# Patient Record
Sex: Male | Born: 1970 | Hispanic: No | Marital: Married | Smoking: Never smoker
Health system: Southern US, Community
[De-identification: ages and names within clinical notes are randomized; demographics above are authoritative.]

## PROBLEM LIST (undated history)

## (undated) DIAGNOSIS — S76399A Other specified injury of muscle, fascia and tendon of the posterior muscle group at thigh level, unspecified thigh, initial encounter: Secondary | ICD-10-CM

## (undated) HISTORY — PX: EYE SURGERY: SHX253

## (undated) HISTORY — DX: Other specified injury of muscle, fascia and tendon of the posterior muscle group at thigh level, unspecified thigh, initial encounter: S76.399A

## (undated) HISTORY — PX: KNEE ARTHROSCOPY W/ ACL RECONSTRUCTION: SHX1858

## (undated) HISTORY — PX: KNEE ARTHROSCOPY: SHX127

## (undated) HISTORY — PX: COLONOSCOPY: SHX174

---

## 2007-02-23 ENCOUNTER — Ambulatory Visit: Payer: Self-pay | Admitting: Internal Medicine

## 2007-02-23 LAB — CONVERTED CEMR LAB
ALT: 27 units/L (ref 0–53)
AST: 20 units/L (ref 0–37)
Albumin: 4.3 g/dL (ref 3.5–5.2)
Basophils Absolute: 0 10*3/uL (ref 0.0–0.1)
Calcium: 10 mg/dL (ref 8.4–10.5)
Chloride: 114 meq/L — ABNORMAL HIGH (ref 96–112)
Creatinine, Ser: 0.9 mg/dL (ref 0.4–1.5)
Eosinophils Absolute: 0 10*3/uL (ref 0.0–0.6)
Eosinophils Relative: 1.1 % (ref 0.0–5.0)
GFR calc non Af Amer: 101 mL/min
Glucose, Bld: 95 mg/dL (ref 70–99)
Ketones, ur: NEGATIVE mg/dL
LDL Cholesterol: 140 mg/dL — ABNORMAL HIGH (ref 0–99)
MCV: 88.5 fL (ref 78.0–100.0)
Nitrite: NEGATIVE
Platelets: 161 10*3/uL (ref 150–400)
RBC: 4.95 M/uL (ref 4.22–5.81)
RDW: 11.9 % (ref 11.5–14.6)
Specific Gravity, Urine: 1.02 (ref 1.000–1.03)
Total Bilirubin: 1.2 mg/dL (ref 0.3–1.2)
Total CHOL/HDL Ratio: 4
Total Protein, Urine: NEGATIVE mg/dL
Urine Glucose: NEGATIVE mg/dL
Urobilinogen, UA: 1 (ref 0.0–1.0)
VLDL: 9 mg/dL (ref 0–40)
WBC: 4 10*3/uL — ABNORMAL LOW (ref 4.5–10.5)
pH: 6.5 (ref 5.0–8.0)

## 2007-02-28 ENCOUNTER — Ambulatory Visit: Payer: Self-pay | Admitting: Internal Medicine

## 2007-02-28 ENCOUNTER — Encounter: Payer: Self-pay | Admitting: Internal Medicine

## 2009-04-29 ENCOUNTER — Ambulatory Visit: Payer: Self-pay | Admitting: Internal Medicine

## 2009-05-03 ENCOUNTER — Ambulatory Visit: Payer: Self-pay | Admitting: Internal Medicine

## 2009-05-03 LAB — CONVERTED CEMR LAB
CO2: 31 meq/L (ref 19–32)
Glucose, Bld: 95 mg/dL (ref 70–99)
HDL: 40.6 mg/dL (ref >39.00)
LDL Cholesterol: 138 mg/dL — ABNORMAL HIGH (ref 0–99)
Potassium: 4.3 meq/L (ref 3.5–5.1)
Sodium: 143 meq/L (ref 135–145)
Total CHOL/HDL Ratio: 5
VLDL: 8.8 mg/dL (ref 0.0–40.0)

## 2009-05-05 ENCOUNTER — Encounter: Payer: Self-pay | Admitting: Internal Medicine

## 2010-06-01 DIAGNOSIS — S76399A Other specified injury of muscle, fascia and tendon of the posterior muscle group at thigh level, unspecified thigh, initial encounter: Secondary | ICD-10-CM

## 2010-06-01 HISTORY — DX: Other specified injury of muscle, fascia and tendon of the posterior muscle group at thigh level, unspecified thigh, initial encounter: S76.399A

## 2010-07-21 ENCOUNTER — Telehealth (INDEPENDENT_AMBULATORY_CARE_PROVIDER_SITE_OTHER): Payer: Self-pay | Admitting: *Deleted

## 2010-07-29 NOTE — Progress Notes (Signed)
  Phone Note Other Incoming   Request: Send information Summary of Call: Request for records received from [facility]. Request forwarded to [facility].  Records for the past 5 yrs.

## 2010-12-07 ENCOUNTER — Other Ambulatory Visit: Payer: Self-pay | Admitting: Orthopedic Surgery

## 2010-12-07 ENCOUNTER — Ambulatory Visit (HOSPITAL_COMMUNITY)
Admission: RE | Admit: 2010-12-07 | Discharge: 2010-12-07 | Disposition: A | Discharge status: Home / Self Care | Payer: 59 | Source: Ambulatory Visit | Attending: Orthopedic Surgery | Admitting: Orthopedic Surgery

## 2010-12-07 DIAGNOSIS — R52 Pain, unspecified: Secondary | ICD-10-CM

## 2010-12-07 DIAGNOSIS — IMO0002 Reserved for concepts with insufficient information to code with codable children: Secondary | ICD-10-CM | POA: Insufficient documentation

## 2010-12-07 DIAGNOSIS — M79609 Pain in unspecified limb: Secondary | ICD-10-CM | POA: Insufficient documentation

## 2012-02-16 ENCOUNTER — Encounter: Payer: Self-pay | Admitting: Internal Medicine

## 2012-02-16 ENCOUNTER — Ambulatory Visit (INDEPENDENT_AMBULATORY_CARE_PROVIDER_SITE_OTHER): Payer: 59 | Admitting: Internal Medicine

## 2012-02-16 VITALS — BP 110/72 | HR 87 | Temp 97.6°F | Resp 16 | Wt 192.0 lb

## 2012-02-16 DIAGNOSIS — Z23 Encounter for immunization: Secondary | ICD-10-CM

## 2012-02-16 DIAGNOSIS — Z0000 Encounter for general adult medical examination without abnormal findings: Secondary | ICD-10-CM

## 2012-02-16 DIAGNOSIS — Z1211 Encounter for screening for malignant neoplasm of colon: Secondary | ICD-10-CM

## 2012-02-16 NOTE — Progress Notes (Signed)
Subjective:    Patient ID: Troy Gentry, male    DOB: 10-Apr-1971, 41 y.o.   MRN: [id number]  HPI Troy Gentry presents for a wellness exam. Interval history since '08: had a torn hamstring. Discussed sexual performance issues. Dentist - current, had crowns. Ophthal - last year -chronic dry eye. Exercise - 30 min on eliptical 5/wk.   Past Medical History  Diagnosis Date  . Avulsion of hamstring muscle 2012   Past Surgical History  Procedure Date  . Knee arthroscopy w/ acl reconstruction     '95, '06 left knee   Family History  Problem Relation Age of Onset  . Arthritis Mother   . Heart disease Father   . Hypertension Father   . Hyperlipidemia Father   . Colitis Sister   . Cancer Paternal Uncle     colon  . Cancer Paternal Grandmother     colon  . Diabetes Paternal Grandmother   . Cancer Paternal Grandfather     colon  . Diabetes Sister   . Heart disease Maternal Grandfather     CAD   History   Social History  . Marital Status: Married    Spouse Name: N/A    Number of Children: 2  . Years of Education: 18   Occupational History  . business Production designer, theatre/television/film    Social History Main Topics  . Smoking status: Never Smoker   . Smokeless tobacco: Never Used  . Alcohol Use: 2.0 - 2.5 oz/week    4-5 drink(s) per week  . Drug Use: No  . Sexually Active: Yes -- Male partner(s)   Other Topics Concern  . Not on file   Social History Narrative   Troy Gentry, Married '98, 1 dtr '01, 1 son '03. 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 Marriage is in good health.     No current outpatient prescriptions on file prior to visit.      Review of Systems Constitutional:  Negative for fever, chills, activity change and unexpected weight change.  HEENT:  Negative for hearing loss, ear pain, congestion, neck stiffness and postnasal drip. Negative for sore throat or swallowing problems. Negative for dental complaints.   Eyes: Negative for vision loss or change in visual acuity.    Respiratory: Negative for chest tightness and wheezing. Negative for DOE.   Cardiovascular: Negative for chest pain or palpitations. No decreased exercise tolerance Gastrointestinal: No change in bowel habit. No bloating or gas. No reflux or indigestion Genitourinary: Negative for urgency, frequency, flank pain and difficulty urinating.  Musculoskeletal: Negative for myalgias, back pain, and gait problem. Right shoulder strain. Neurological: Negative for dizziness, tremors, weakness and headaches.  Hematological: Negative for adenopathy.  Psychiatric/Behavioral: Negative for behavioral problems and dysphoric mood.       Objective:   Physical Exam Filed Vitals:   02/16/12 1459  BP: 110/72  Pulse: 87  Temp: 97.6 F (36.4 C)  Resp: 16   Wt Readings from Last 3 Encounters:  02/16/12 192 lb (87.091 kg)  04/29/09 197 lb (89.359 kg)  02/28/07 188 lb (85.276 kg)   Gen'l: Well nourished well developed white male in no acute distress  HEENT: Head: Normocephalic and atraumatic. Right Ear: External ear normal. EAC/TM nl. Left Ear: External ear normal.  EAC/TM nl. Nose: Nose normal. Mouth/Throat: Oropharynx is clear and moist. Dentition - native, in good repair. No buccal or palatal lesions. Posterior pharynx clear. Eyes: Conjunctivae and sclera clear. EOM intact. Pupils are equal, round, and reactive to light.  Right eye exhibits no discharge. Left eye exhibits no discharge. Neck: Normal range of motion. Neck supple. No JVD present. No tracheal deviation present. No thyromegaly present.  Cardiovascular: Normal rate, regular rhythm, no gallop, no friction rub, no murmur heard.      Quiet precordium. 2+ radial and DP pulses . No carotid bruits Pulmonary/Chest: Effort normal. No respiratory distress or increased WOB, no wheezes, no rales. No chest wall deformity or CVAT. Abdomen: Soft. Bowel sounds are normal in all quadrants. He exhibits no distension, no tenderness, no rebound or guarding, No  heptosplenomegaly  Genitourinary:  deferred Musculoskeletal: Normal range of motion. He exhibits no edema and no tenderness.       Small and large joints without redness, synovial thickening or deformity. Full range of motion preserved about all small, median and large joints.  Lymphadenopathy:    He has no cervical or supraclavicular adenopathy.  Neurological: He is alert and oriented to person, place, and time. CN II-XII intact. DTRs 2+ and symmetrical biceps, radial and patellar tendons. Cerebellar function normal with no tremor, rigidity, normal gait and station.  Skin: Skin is warm and dry. No rash noted. No erythema.  Psychiatric: He has a normal mood and affect. His behavior is normal. Thought content normal.    Lab Results  Component Value Date   WBC 4.0* 02/23/2007   HGB 15.3 02/23/2007   HCT 43.8 02/23/2007   PLT 161 02/23/2007   GLUCOSE 106* 02/19/2012   CHOL 202* 02/19/2012   TRIG 47.0 02/19/2012   HDL 47.30 02/19/2012   LDLDIRECT 137.1 02/19/2012   LDLCALC 138* 05/03/2009        ALT 24 02/19/2012   AST 16 02/19/2012        NA 140 02/19/2012   K 4.5 02/19/2012   CL 106 02/19/2012   CREATININE 1.1 02/19/2012   BUN 20 02/19/2012   CO2 29 02/19/2012   TSH 1.83 02/23/2007   PSA 0.52 02/23/2007          Assessment & Plan:

## 2012-02-19 ENCOUNTER — Other Ambulatory Visit (INDEPENDENT_AMBULATORY_CARE_PROVIDER_SITE_OTHER): Payer: 59

## 2012-02-19 DIAGNOSIS — Z13220 Encounter for screening for lipoid disorders: Secondary | ICD-10-CM

## 2012-02-19 DIAGNOSIS — Z0000 Encounter for general adult medical examination without abnormal findings: Secondary | ICD-10-CM

## 2012-02-19 LAB — COMPREHENSIVE METABOLIC PANEL
AST: 16 U/L (ref 0–37)
Albumin: 4.2 g/dL (ref 3.5–5.2)
Alkaline Phosphatase: 35 U/L — ABNORMAL LOW (ref 39–117)
Glucose, Bld: 106 mg/dL — ABNORMAL HIGH (ref 70–99)
Potassium: 4.5 mEq/L (ref 3.5–5.1)
Sodium: 140 mEq/L (ref 135–145)
Total Bilirubin: 1 mg/dL (ref 0.3–1.2)
Total Protein: 6.7 g/dL (ref 6.0–8.3)

## 2012-02-19 LAB — LIPID PANEL
HDL: 47.3 mg/dL (ref >39.00)
Triglycerides: 47 mg/dL (ref 0.0–149.0)
VLDL: 9.4 mg/dL (ref 0.0–40.0)

## 2012-02-19 LAB — HEPATIC FUNCTION PANEL
ALT: 24 U/L (ref 0–53)
AST: 16 U/L (ref 0–37)
Bilirubin, Direct: 0.1 mg/dL (ref 0.0–0.3)
Total Bilirubin: 1 mg/dL (ref 0.3–1.2)
Total Protein: 6.7 g/dL (ref 6.0–8.3)

## 2012-02-19 LAB — LDL CHOLESTEROL, DIRECT: Direct LDL: 137.1 mg/dL

## 2012-02-20 DIAGNOSIS — Z0000 Encounter for general adult medical examination without abnormal findings: Secondary | ICD-10-CM | POA: Insufficient documentation

## 2012-02-20 NOTE — Assessment & Plan Note (Signed)
Interval medical history negative for any major illness, surgery or injury. Physical exam normal. Immunizations - tetanus brought up to date. Lab - in normal limits except for LDL cholesterol which is just above target level of 130 or less - recommend life-style management with low fat choices and continued regular exercise. 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 Discussed pros and cons of prostate cancer screening (USPHCTF recommendations reviewed and ACU April '13 recommendations) and he defers evaluation at this time.    In summary - a very nice, healthy man who is medically stable. He will continue to exercise and adhere to a low fat diet. He will return in 18-24 months for wellness exam.

## 2012-03-03 ENCOUNTER — Other Ambulatory Visit: Payer: Self-pay | Admitting: Internal Medicine

## 2012-03-03 ENCOUNTER — Telehealth: Payer: Self-pay

## 2012-03-03 DIAGNOSIS — Z1211 Encounter for screening for malignant neoplasm of colon: Secondary | ICD-10-CM

## 2012-03-03 DIAGNOSIS — R739 Hyperglycemia, unspecified: Secondary | ICD-10-CM

## 2012-03-03 NOTE — Telephone Encounter (Signed)
DR.NORINS SPOKE WITH PATIENT REGARDING QUESTIONS FROM PATIENT

## 2012-03-03 NOTE — Telephone Encounter (Signed)
1. ADA normal blood sugar 70-115, he was 95 at previous lab and 106 this lab - both normal. He can certainly have an A1C but I wager it will be normal  2. Last CBC in the office WBC was normal @ 4.0. He was not ill with no evidence of infection so CBC wasn't indicated. He had a very normal history and exam making leukemia or other hematologic disease extremely unlikely.  3. Normal colorectal cancer screening with colonoscopy starts at age 41. Start earlier if 1st degree relative had colon cancer- parent or sibling. Grandparents - 2nd degree kinship - there is not a recommendation for early screening in the absence of symptoms - changed bowel habit etc. Happy to schedule for colonoscopy but do not know if insurance will cover (est cost $2,000 - MD plus endo center)

## 2012-03-03 NOTE — Telephone Encounter (Signed)
Pt called with follow up questions to recent labs results, 1-pt is concerned about glucose elevation although he was fasted, he is requesting and A1C, 2-Pt says his last labs showed an elevated white count and he would like to know why a CBC was not done and 3-pt discussed possible colon screening with MD but has not heard from office on whether this was recommended, please advise.

## 2013-08-03 ENCOUNTER — Other Ambulatory Visit (INDEPENDENT_AMBULATORY_CARE_PROVIDER_SITE_OTHER): Payer: BC Managed Care – PPO

## 2013-08-03 ENCOUNTER — Ambulatory Visit (INDEPENDENT_AMBULATORY_CARE_PROVIDER_SITE_OTHER): Payer: BC Managed Care – PPO | Admitting: Internal Medicine

## 2013-08-03 ENCOUNTER — Encounter: Payer: Self-pay | Admitting: Internal Medicine

## 2013-08-03 VITALS — BP 112/72 | HR 73 | Temp 97.4°F | Ht 71.0 in | Wt 193.0 lb

## 2013-08-03 DIAGNOSIS — Z0000 Encounter for general adult medical examination without abnormal findings: Secondary | ICD-10-CM

## 2013-08-03 DIAGNOSIS — Z800 Family history of malignant neoplasm of digestive organs: Secondary | ICD-10-CM

## 2013-08-03 LAB — LIPID PANEL
CHOL/HDL RATIO: 4
Cholesterol: 217 mg/dL — ABNORMAL HIGH (ref 0–200)
HDL: 56.9 mg/dL (ref >39.00)
LDL Cholesterol: 152 mg/dL — ABNORMAL HIGH (ref 0–99)
TRIGLYCERIDES: 40 mg/dL (ref 0.0–149.0)
VLDL: 8 mg/dL (ref 0.0–40.0)

## 2013-08-03 NOTE — Progress Notes (Signed)
Pre visit review using our clinic review tool, if applicable. No additional management support is needed unless otherwise documented below in the visit note. 

## 2013-08-03 NOTE — Patient Instructions (Signed)
Thanks for coming in to see me today. Moving forward I will pass the baton to Dr. Parks Ranger.  Your exam is normal. You had normal chemistries and do not need a repeat at this time. Will check cholesterol panel and results will be posted to MyChart.   If the viagra or levitra works well MyChart me a message for a prescription.  Will refer for colonoscopy - may get insurance push back.  You should have a routine exam in 2 years or so. If you need lab in less time that should be ok without having to have a full exam, same goes for Rx for Viagra.

## 2013-08-03 NOTE — Progress Notes (Signed)
Subjective:    Patient ID: Troy Gentry, male    DOB: 06-07-1970, 43 y.o.   MRN: [id number]  HPI Troy Gentry presents for an annual wellness exam. Troy Gentry has been doing well with no major medical issues. Troy Gentry is concerned about risk of colon cancer with multiple relatives with colon cancer.   Past Medical History  Diagnosis Date  . Avulsion of hamstring muscle 2012   Past Surgical History  Procedure Laterality Date  . Knee arthroscopy w/ acl reconstruction      '95, '06 left knee   Family History  Problem Relation Age of Onset  . Arthritis Mother   . Heart disease Father   . Hypertension Father   . Hyperlipidemia Father   . Colitis Sister   . Cancer Paternal Uncle     colon  . Cancer Paternal Grandmother     colon  . Diabetes Paternal Grandmother   . Cancer Paternal Grandfather     colon  . Diabetes Sister   . Heart disease Maternal Grandfather     CAD   History   Social History  . Marital Status: Married    Spouse Name: N/A    Number of Children: 2  . Years of Education: 18   Occupational History  . business Freight forwarder    Social History Main Topics  . Smoking status: Never Smoker   . Smokeless tobacco: Never Used  . Alcohol Use: 2 - 2.5 oz/week    4-5 drink(s) per week  . Drug Use: No  . Sexual Activity: Yes    Partners: Female   Other Topics Concern  . Not on file   Social History Narrative   [employer] [facility], Married '98, 1 dtr '01, 1 son '03. Work - Passenger transport manager for [employer]. Marriage is in good health.     Current Outpatient Prescriptions on File Prior to Visit  Medication Sig Dispense Refill  . cycloSPORINE (RESTASIS) 0.05 % ophthalmic emulsion Place 1 drop into both eyes 2 (two) times daily.       No current facility-administered medications on file prior to visit.      Review of Systems Constitutional:  Negative for fever, chills, activity change and unexpected weight change.  HEENT:  Negative for hearing loss, ear pain, congestion, neck  stiffness and postnasal drip. Negative for sore throat or swallowing problems. Negative for dental complaints.   Eyes: Negative for vision loss or change in visual acuity.  Respiratory: Negative for chest tightness and wheezing. Negative for DOE.   Cardiovascular: Negative for chest pain or palpitations. No decreased exercise tolerance Gastrointestinal: No change in bowel habit. No bloating or gas. No reflux or indigestion Genitourinary: Negative for urgency, frequency, flank pain and difficulty urinating.  Musculoskeletal: Negative for myalgias, back pain, arthralgias and gait problem.  Neurological: Negative for dizziness, tremors, weakness and headaches.  Hematological: Negative for adenopathy.  Psychiatric/Behavioral: Negative for behavioral problems and dysphoric mood.       Objective:   Physical Exam Filed Vitals:   08/03/13 1008  BP: 112/72  Pulse: 73  Temp: 97.4 F (36.3 C)   Wt Readings from Last 3 Encounters:  08/03/13 193 lb (87.544 kg)  02/16/12 192 lb (87.091 kg)  04/29/09 197 lb (89.359 kg)   Gen'l: Well nourished well developed male in no acute distress  HEENT: Head: Normocephalic and atraumatic. Right Ear: External ear normal. EAC/TM nl. Left Ear: External ear normal.  EAC/TM nl. Nose: Nose normal. Mouth/Throat: Oropharynx is clear and  moist. Dentition - native, in good repair. No buccal or palatal lesions. Posterior pharynx clear. Eyes: Conjunctivae and sclera clear. EOM intact. Pupils are equal, round, and reactive to light. Right eye exhibits no discharge. Left eye exhibits no discharge. Neck: Normal range of motion. Neck supple. No JVD present. No tracheal deviation present. No thyromegaly present.  Cardiovascular: Normal rate, regular rhythm, no gallop, no friction rub, no murmur heard.      Quiet precordium. 2+ radial and DP pulses . No carotid bruits Pulmonary/Chest: Effort normal. No respiratory distress or increased WOB, no wheezes, no rales. No chest wall  deformity or CVAT. Abdomen: Soft. Bowel sounds are normal in all quadrants. Troy Gentry exhibits no distension, no tenderness, no rebound or guarding, No heptosplenomegaly  Genitourinary: deferred  Musculoskeletal: Normal range of motion. Troy Gentry exhibits no edema and no tenderness.       Small and large joints without redness, synovial thickening or deformity. Full range of motion preserved about all small, median and large joints.  Lymphadenopathy:    Troy Gentry has no cervical or supraclavicular adenopathy.  Neurological: Troy Gentry is alert and oriented to person, place, and time. CN II-XII intact. DTRs 2+ and symmetrical biceps, radial and patellar tendons. Cerebellar function normal with no tremor, rigidity, normal gait and station.  Skin: Skin is warm and dry. No rash noted. No erythema.  Psychiatric: Troy Gentry has a normal mood and affect. His behavior is normal. Thought content normal.   Recent Results (from the past 2160 hour(s))  LIPID PANEL     Status: Abnormal   Collection Time    08/03/13 11:16 AM      Result Value Ref Range   Cholesterol 217 (*) 0 - 200 mg/dL   Comment: ATP III Classification       Desirable:  < 200 mg/dL               Borderline High:  200 - 239 mg/dL          High:  > = 240 mg/dL   Triglycerides 40.0  0.0 - 149.0 mg/dL   Comment: Normal:  <150 mg/dLBorderline High:  150 - 199 mg/dL   HDL 56.90  >39.00 mg/dL   VLDL 8.0  0.0 - 40.0 mg/dL   LDL Cholesterol 152 (*) 0 - 99 mg/dL   Total CHOL/HDL Ratio 4     Comment:                Men          Women1/2 Average Risk     3.4          3.3Average Risk          5.0          4.42X Average Risk          9.6          7.13X Average Risk          15.0          11.0                               Assessment & Plan:

## 2013-08-04 ENCOUNTER — Encounter: Payer: Self-pay | Admitting: Internal Medicine

## 2013-08-04 DIAGNOSIS — R899 Unspecified abnormal finding in specimens from other organs, systems and tissues: Secondary | ICD-10-CM

## 2013-08-06 NOTE — Assessment & Plan Note (Signed)
Interval history is negative for major illness, injury or surgery. Physical exam is normal. Lab results - moderate elevation in LDL at 152. Patient informed by Mychart - over goal LDL below treatment threshold (NCEP ATP III)  - he will decide if he can improve life-style or if he wants to take medication. Immunization - current tetanus.  In summary A nice man, medically stable who should approach cholesterol elevation with diet and exercise as first choice treatment.

## 2013-09-11 ENCOUNTER — Encounter: Payer: Self-pay | Admitting: Internal Medicine

## 2013-10-17 ENCOUNTER — Other Ambulatory Visit: Payer: Self-pay | Admitting: Orthopedic Surgery

## 2013-10-17 DIAGNOSIS — M79642 Pain in left hand: Secondary | ICD-10-CM

## 2013-10-27 ENCOUNTER — Ambulatory Visit
Admission: RE | Admit: 2013-10-27 | Discharge: 2013-10-27 | Disposition: A | Discharge status: Home / Self Care | Payer: BC Managed Care – PPO | Source: Ambulatory Visit | Attending: Orthopedic Surgery | Admitting: Orthopedic Surgery

## 2013-10-27 ENCOUNTER — Other Ambulatory Visit: Payer: BC Managed Care – PPO

## 2013-10-27 DIAGNOSIS — M79642 Pain in left hand: Secondary | ICD-10-CM

## 2014-10-08 ENCOUNTER — Other Ambulatory Visit: Payer: Self-pay

## 2014-10-08 NOTE — Telephone Encounter (Signed)
Received this script from pharmacy for Viagra. Do not see on current med list

## 2014-10-08 NOTE — Telephone Encounter (Signed)
Dr Linda Hedges retired ; it will be necessary for him to establish with a primary care physician for additional refills.

## 2014-10-17 ENCOUNTER — Other Ambulatory Visit: Payer: Self-pay | Admitting: Geriatric Medicine

## 2014-11-08 ENCOUNTER — Telehealth: Payer: Self-pay | Admitting: Internal Medicine

## 2014-11-08 NOTE — Telephone Encounter (Signed)
Spoke with patient about transferring from [facility] to another provider. Patient stated he would have to call back because he was not sure what he wanted to do right now.

## 2015-01-25 ENCOUNTER — Other Ambulatory Visit: Payer: Self-pay | Admitting: Otolaryngology

## 2015-01-25 ENCOUNTER — Other Ambulatory Visit (HOSPITAL_COMMUNITY)
Admission: RE | Admit: 2015-01-25 | Discharge: 2015-01-25 | Disposition: A | Discharge status: Home / Self Care | Payer: BLUE CROSS/BLUE SHIELD | Source: Ambulatory Visit | Attending: Otolaryngology | Admitting: Otolaryngology

## 2015-01-25 DIAGNOSIS — R221 Localized swelling, mass and lump, neck: Secondary | ICD-10-CM | POA: Diagnosis present

## 2015-01-30 ENCOUNTER — Other Ambulatory Visit: Payer: Self-pay | Admitting: Otolaryngology

## 2015-01-30 DIAGNOSIS — R221 Localized swelling, mass and lump, neck: Secondary | ICD-10-CM

## 2015-02-22 ENCOUNTER — Ambulatory Visit
Admission: RE | Admit: 2015-02-22 | Discharge: 2015-02-22 | Disposition: A | Discharge status: Home / Self Care | Payer: BLUE CROSS/BLUE SHIELD | Source: Ambulatory Visit | Attending: Otolaryngology | Admitting: Otolaryngology

## 2015-02-22 DIAGNOSIS — R221 Localized swelling, mass and lump, neck: Secondary | ICD-10-CM

## 2015-05-08 ENCOUNTER — Encounter: Payer: Self-pay | Admitting: Internal Medicine

## 2015-06-07 ENCOUNTER — Other Ambulatory Visit (HOSPITAL_COMMUNITY): Payer: Self-pay | Admitting: Orthopedic Surgery

## 2015-06-13 ENCOUNTER — Encounter (HOSPITAL_COMMUNITY)
Admission: RE | Admit: 2015-06-13 | Discharge: 2015-06-13 | Disposition: A | Discharge status: Home / Self Care | Payer: Managed Care, Other (non HMO) | Source: Ambulatory Visit | Attending: Orthopedic Surgery | Admitting: Orthopedic Surgery

## 2015-06-13 ENCOUNTER — Encounter (HOSPITAL_COMMUNITY): Payer: Self-pay

## 2015-06-13 DIAGNOSIS — Z01812 Encounter for preprocedural laboratory examination: Secondary | ICD-10-CM | POA: Insufficient documentation

## 2015-06-13 LAB — CBC
HEMATOCRIT: 43 % (ref 39.0–52.0)
Hemoglobin: 14.6 g/dL (ref 13.0–17.0)
MCH: 30 pg (ref 26.0–34.0)
MCHC: 34 g/dL (ref 30.0–36.0)
MCV: 88.5 fL (ref 78.0–100.0)
Platelets: 134 10*3/uL — ABNORMAL LOW (ref 150–400)
RBC: 4.86 MIL/uL (ref 4.22–5.81)
RDW: 13.2 % (ref 11.5–15.5)
WBC: 5.1 10*3/uL (ref 4.0–10.5)

## 2015-06-13 NOTE — Pre-Procedure Instructions (Addendum)
    JAADEN CLOTFELTER  06/13/2015    Your procedure is scheduled on Monday, January 16.  Report to [facility] Admitting at 9:00 A.M.  Call this number if you have problems the morning of surgery:[phone]   Remember:  Do not eat food or drink liquids after midnight Sunday, January 15.  Take these medicines the morning of surgery with A SIP OF WATER : None             STOP taking Voltaren .  Do Nott take Aspirin, Aspirin Products, Advil ( Ibuprofen), Aleve (Naproxen).   Do not wear jewelry, make-up or nail polish.  Do not wear lotions, powders, or perfumes             Men may shave face and neck.  Do not bring valuables to the hospital.  [facility] is not responsible for any belongings or valuables.  Contacts, dentures or bridgework may not be worn into surgery.  Leave your suitcase in the car.  After surgery it may be brought to your room.  For patients admitted to the hospital, discharge time will be determined by your treatment team.  Patients discharged the day of surgery will not be allowed to drive home.   Name and phone number of your driver:  -  Special instructions:  Review  [place] - Preparing For Surgery.  Please read over the following fact sheets that you were given. Pain Booklet, Coughing and Deep Breathing and Surgical Site Infection Prevention

## 2015-06-14 MED ORDER — CEFAZOLIN SODIUM-DEXTROSE 2-3 GM-% IV SOLR
2.0000 g | INTRAVENOUS | Status: AC
  Administered 2015-06-17: 2 g via INTRAVENOUS
  Filled 2015-06-14: qty 50

## 2015-06-14 MED ORDER — CHLORHEXIDINE GLUCONATE 4 % EX LIQD: 60.0000 mL | Freq: Once | CUTANEOUS | Status: DC

## 2015-06-14 NOTE — H&P (Signed)
Troy Gentry is an 45 y.o. male.   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 He's had shoulder pain for many months but has become worse as he is tried to throw with his son. Reports pain with extension and abduction. Localizes the pain to the front of the shoulder. Does have some mechanical symptoms at times but really has a lot of pain whenever he tries to throw the ball. He is fairly asymptomatic when he plays golf. He denies any neck pain or radicular symptoms of numbness and tingling in the right arm  Past Medical History  Diagnosis Date  . Avulsion of hamstring muscle 2012    Past Surgical History  Procedure Laterality Date  . Knee arthroscopy w/ acl reconstruction      '95, '06 left knee  . Eye surgery      Lasik    Family History  Problem Relation Age of Onset  . Arthritis Mother   . Heart disease Father   . Hypertension Father   . Hyperlipidemia Father   . Colitis Sister   . Cancer Paternal Uncle     colon  . Cancer Paternal Grandmother     colon  . Diabetes Paternal Grandmother   . Cancer Paternal Grandfather     colon  . Diabetes Sister   . Heart disease Maternal Grandfather     CAD   Social History:  reports that he has never smoked. He has never used smokeless tobacco. He reports that he drinks about 4.2 - 4.8 oz of alcohol per week. He reports that he does not use illicit drugs.  Allergies: No Known Allergies  No prescriptions prior to admission    Results for orders placed or performed during the hospital encounter of 06/13/15 (from the past 48 hour(s))  CBC     Status: Abnormal   Collection Time: 06/13/15 11:24 AM  Result Value Ref Range   WBC 5.1 4.0 - 10.5 K/uL   RBC 4.86 4.22 - 5.81 MIL/uL   Hemoglobin 14.6 13.0 - 17.0 g/dL   HCT 43.0 39.0 - 52.0 %   MCV 88.5 78.0 - 100.0 fL   MCH 30.0 26.0 - 34.0 pg   MCHC 34.0 30.0 - 36.0 g/dL   RDW 13.2 11.5 - 15.5 %   Platelets 134 (L)  150 - 400 K/uL   No results found.  Review of Systems  Constitutional: Negative.   HENT: Negative.   Eyes: Negative.   Respiratory: Negative.   Cardiovascular: Negative.   Gastrointestinal: Negative.   Genitourinary: Negative.   Musculoskeletal: Positive for joint pain.  Skin: Negative.   Neurological: Negative.   Endo/Heme/Allergies: Negative.   Psychiatric/Behavioral: Negative.     There were no vitals taken for this visit. Physical Exam  Constitutional: He appears well-developed.  HENT:  Head: Normocephalic.  Eyes: Pupils are equal, round, and reactive to light.  Neck: Normal range of motion.  Cardiovascular: Normal rate.   Respiratory: Effort normal.  GI: Soft.  Neurological: He is alert.  Skin: Skin is warm.   examination of the right arm demonstrates good range of motion positive O'Brien's testing no restriction extra rotation 15 abduction no before meals joint tenderness direct palpation or with cross arm adduction no other masses lymphadenopathy or skin changes noted in the shoulder girdle region has good strength interspace of space upset muscle testing motor sensory function to the arm is intact  Assessment/Plan Impression is right shoulder biceps  tendon subluxation medially with tearing of the upper fibers of the subscap plan arthroscopy with biceps tenodesis bursectomy any open rotator cuff repair of the upper portion of the subscap. Risk and benefits discussed with the patient including but limited to infection or vessel damage shoulder stiffness potential for rerupture the biceps tendon. Anticipated recovery course discussed will plan to use CPM machine postop for this patient all questions answered  Hinton Troy Gentry 06/14/2015, 9:56 PM

## 2015-06-17 ENCOUNTER — Ambulatory Visit (HOSPITAL_COMMUNITY): Payer: Managed Care, Other (non HMO) | Admitting: Certified Registered Nurse Anesthetist

## 2015-06-17 ENCOUNTER — Ambulatory Visit (HOSPITAL_COMMUNITY)
Admission: RE | Admit: 2015-06-17 | Discharge: 2015-06-17 | Disposition: A | Discharge status: Home / Self Care | Payer: Managed Care, Other (non HMO) | Source: Ambulatory Visit | Attending: Orthopedic Surgery | Admitting: Orthopedic Surgery

## 2015-06-17 ENCOUNTER — Encounter (HOSPITAL_COMMUNITY)
Admission: RE | Disposition: A | Discharge status: Home / Self Care | Payer: Self-pay | Source: Ambulatory Visit | Attending: Orthopedic Surgery

## 2015-06-17 ENCOUNTER — Encounter (HOSPITAL_COMMUNITY): Payer: Self-pay | Admitting: Certified Registered Nurse Anesthetist

## 2015-06-17 DIAGNOSIS — S43491A Other sprain of right shoulder joint, initial encounter: Secondary | ICD-10-CM | POA: Diagnosis not present

## 2015-06-17 DIAGNOSIS — S43081A Other subluxation of right shoulder joint, initial encounter: Secondary | ICD-10-CM | POA: Insufficient documentation

## 2015-06-17 DIAGNOSIS — X58XXXA Exposure to other specified factors, initial encounter: Secondary | ICD-10-CM | POA: Insufficient documentation

## 2015-06-17 HISTORY — PX: SHOULDER ARTHROSCOPY: SHX128

## 2015-06-17 SURGERY — ARTHROSCOPY, SHOULDER
Anesthesia: General | Site: Shoulder | Laterality: Right

## 2015-06-17 MED ORDER — SODIUM CHLORIDE 0.9 % IJ SOLN
INTRAMUSCULAR | Status: DC | PRN
  Administered 2015-06-17: 30 mL via INTRAVENOUS

## 2015-06-17 MED ORDER — METOPROLOL TARTRATE 1 MG/ML IV SOLN
INTRAVENOUS | Status: AC
  Filled 2015-06-17: qty 5

## 2015-06-17 MED ORDER — OXYCODONE-ACETAMINOPHEN 5-325 MG PO TABS: 1.0000 | ORAL_TABLET | Freq: Four times a day (QID) | ORAL | Status: DC | PRN

## 2015-06-17 MED ORDER — HYDROMORPHONE HCL 1 MG/ML IJ SOLN: 0.2500 mg | INTRAMUSCULAR | Status: DC | PRN

## 2015-06-17 MED ORDER — LACTATED RINGERS IV SOLN
INTRAVENOUS | Status: DC | PRN
  Administered 2015-06-17 (×4): via INTRAVENOUS

## 2015-06-17 MED ORDER — ONDANSETRON HCL 4 MG/2ML IJ SOLN
INTRAMUSCULAR | Status: AC
  Filled 2015-06-17: qty 2

## 2015-06-17 MED ORDER — EPHEDRINE SULFATE 50 MG/ML IJ SOLN
INTRAMUSCULAR | Status: DC | PRN
  Administered 2015-06-17: 10 mg via INTRAVENOUS
  Administered 2015-06-17: 15 mg via INTRAVENOUS
  Administered 2015-06-17 (×2): 5 mg via INTRAVENOUS
  Administered 2015-06-17: 10 mg via INTRAVENOUS
  Administered 2015-06-17: 5 mg via INTRAVENOUS

## 2015-06-17 MED ORDER — SUGAMMADEX SODIUM 200 MG/2ML IV SOLN
INTRAVENOUS | Status: AC
  Filled 2015-06-17: qty 2

## 2015-06-17 MED ORDER — BUPIVACAINE HCL (PF) 0.25 % IJ SOLN
INTRAMUSCULAR | Status: DC | PRN
Start: 2015-06-17 — End: 2015-06-17
  Administered 2015-06-17: 1 mL

## 2015-06-17 MED ORDER — EPINEPHRINE HCL 1 MG/ML IJ SOLN
INTRAMUSCULAR | Status: AC
  Filled 2015-06-17: qty 1

## 2015-06-17 MED ORDER — PROMETHAZINE HCL 25 MG/ML IJ SOLN: 6.2500 mg | INTRAMUSCULAR | Status: DC | PRN

## 2015-06-17 MED ORDER — FENTANYL CITRATE (PF) 250 MCG/5ML IJ SOLN
INTRAMUSCULAR | Status: DC | PRN
  Administered 2015-06-17 (×2): 50 ug via INTRAVENOUS

## 2015-06-17 MED ORDER — MIDAZOLAM HCL 2 MG/2ML IJ SOLN
INTRAMUSCULAR | Status: AC
  Administered 2015-06-17: 2 mg
  Filled 2015-06-17: qty 2

## 2015-06-17 MED ORDER — SUGAMMADEX SODIUM 200 MG/2ML IV SOLN
INTRAVENOUS | Status: DC | PRN
  Administered 2015-06-17: 178.8 mg via INTRAVENOUS

## 2015-06-17 MED ORDER — NEOSTIGMINE METHYLSULFATE 10 MG/10ML IV SOLN
INTRAVENOUS | Status: AC
  Filled 2015-06-17: qty 1

## 2015-06-17 MED ORDER — GLYCOPYRROLATE 0.2 MG/ML IJ SOLN
INTRAMUSCULAR | Status: AC
  Filled 2015-06-17: qty 4

## 2015-06-17 MED ORDER — FENTANYL CITRATE (PF) 100 MCG/2ML IJ SOLN
INTRAMUSCULAR | Status: AC
  Filled 2015-06-17: qty 2

## 2015-06-17 MED ORDER — METHOCARBAMOL 500 MG PO TABS: 500.0000 mg | ORAL_TABLET | Freq: Four times a day (QID) | ORAL | Status: DC

## 2015-06-17 MED ORDER — PROPOFOL 10 MG/ML IV BOLUS
INTRAVENOUS | Status: DC | PRN
  Administered 2015-06-17: 200 mg via INTRAVENOUS

## 2015-06-17 MED ORDER — SODIUM CHLORIDE 0.9 % IR SOLN
Status: DC | PRN
  Administered 2015-06-17 (×2): 3000 mL

## 2015-06-17 MED ORDER — FENTANYL CITRATE (PF) 100 MCG/2ML IJ SOLN
INTRAMUSCULAR | Status: AC
  Administered 2015-06-17: 75 ug
  Filled 2015-06-17: qty 2

## 2015-06-17 MED ORDER — PHENYLEPHRINE 40 MCG/ML (10ML) SYRINGE FOR IV PUSH (FOR BLOOD PRESSURE SUPPORT)
PREFILLED_SYRINGE | INTRAVENOUS | Status: AC
  Filled 2015-06-17: qty 10

## 2015-06-17 MED ORDER — EPHEDRINE SULFATE 50 MG/ML IJ SOLN
INTRAMUSCULAR | Status: AC
  Filled 2015-06-17: qty 1

## 2015-06-17 MED ORDER — GLYCOPYRROLATE 0.2 MG/ML IJ SOLN
INTRAMUSCULAR | Status: DC | PRN
  Administered 2015-06-17: 0.2 mg via INTRAVENOUS

## 2015-06-17 MED ORDER — BUPIVACAINE HCL (PF) 0.25 % IJ SOLN
INTRAMUSCULAR | Status: AC
  Filled 2015-06-17: qty 30

## 2015-06-17 MED ORDER — GLYCOPYRROLATE 0.2 MG/ML IJ SOLN
INTRAMUSCULAR | Status: AC
  Filled 2015-06-17: qty 1

## 2015-06-17 MED ORDER — ONDANSETRON HCL 4 MG/2ML IJ SOLN
INTRAMUSCULAR | Status: DC | PRN
  Administered 2015-06-17: 4 mg via INTRAVENOUS

## 2015-06-17 MED ORDER — PHENYLEPHRINE HCL 10 MG/ML IJ SOLN
INTRAMUSCULAR | Status: DC | PRN
  Administered 2015-06-17: 80 ug via INTRAVENOUS

## 2015-06-17 MED ORDER — ARTIFICIAL TEARS OP OINT
TOPICAL_OINTMENT | OPHTHALMIC | Status: DC | PRN
  Administered 2015-06-17: 1 via OPHTHALMIC

## 2015-06-17 MED ORDER — BUPIVACAINE-EPINEPHRINE (PF) 0.5% -1:200000 IJ SOLN
INTRAMUSCULAR | Status: DC | PRN
  Administered 2015-06-17: 25 mL via PERINEURAL

## 2015-06-17 MED ORDER — PROPOFOL 10 MG/ML IV BOLUS
INTRAVENOUS | Status: AC
  Filled 2015-06-17: qty 20

## 2015-06-17 MED ORDER — ROCURONIUM BROMIDE 50 MG/5ML IV SOLN
INTRAVENOUS | Status: AC
  Filled 2015-06-17: qty 1

## 2015-06-17 MED ORDER — EPINEPHRINE HCL 1 MG/ML IJ SOLN
INTRAMUSCULAR | Status: DC | PRN
  Administered 2015-06-17: 1 mg

## 2015-06-17 MED ORDER — FENTANYL CITRATE (PF) 250 MCG/5ML IJ SOLN
INTRAMUSCULAR | Status: AC
  Filled 2015-06-17: qty 5

## 2015-06-17 MED ORDER — LIDOCAINE HCL (CARDIAC) 20 MG/ML IV SOLN
INTRAVENOUS | Status: AC
  Filled 2015-06-17: qty 5

## 2015-06-17 MED ORDER — ROCURONIUM BROMIDE 100 MG/10ML IV SOLN
INTRAVENOUS | Status: DC | PRN
  Administered 2015-06-17: 50 mg via INTRAVENOUS

## 2015-06-17 MED ORDER — ARTIFICIAL TEARS OP OINT
TOPICAL_OINTMENT | OPHTHALMIC | Status: AC
  Filled 2015-06-17: qty 3.5

## 2015-06-17 MED ORDER — LIDOCAINE HCL (CARDIAC) 20 MG/ML IV SOLN
INTRAVENOUS | Status: DC | PRN
  Administered 2015-06-17: 80 mg via INTRATRACHEAL

## 2015-06-17 SURGICAL SUPPLY — 84 items
2.9MM JUGGERKNOT ×1 IMPLANT
ANCH SUT 2 2.9 2 LD BLU (Anchor) ×1 IMPLANT
ANCHOR JUGGERKNOT 2.9 (Anchor) ×1 IMPLANT
APL SKNCLS STERI-STRIP NONHPOA (GAUZE/BANDAGES/DRESSINGS) ×1
BENZOIN TINCTURE PRP APPL 2/3 (GAUZE/BANDAGES/DRESSINGS) ×2 IMPLANT
BIT DRILL TAK (DRILL) IMPLANT
BLADE CUDA 5.5 (BLADE) IMPLANT
BLADE CUTTER GATOR 3.5 (BLADE) ×2 IMPLANT
BLADE GREAT WHITE 4.2 (BLADE) ×2 IMPLANT
BLADE SURG 11 STRL SS (BLADE) ×2 IMPLANT
BUR GATOR 2.9 (BURR) IMPLANT
BUR OVAL 6.0 (BURR) ×2 IMPLANT
CANNULA SHOULDER 7CM (CANNULA) IMPLANT
CARTRIDGE CURVETEK MED (MISCELLANEOUS) IMPLANT
CARTRIDGE CURVETEK XLRG (MISCELLANEOUS) IMPLANT
CLSR STERI-STRIP ANTIMIC 1/2X4 (GAUZE/BANDAGES/DRESSINGS) ×1 IMPLANT
COVER SURGICAL LIGHT HANDLE (MISCELLANEOUS) ×2 IMPLANT
DRAPE IMP U-DRAPE 54X76 (DRAPES) ×2 IMPLANT
DRAPE INCISE IOBAN 66X45 STRL (DRAPES) ×4 IMPLANT
DRAPE STERI 35X30 U-POUCH (DRAPES) ×2 IMPLANT
DRAPE U-SHAPE 47X51 STRL (DRAPES) ×4 IMPLANT
DRILL TAK (DRILL)
DRSG PAD ABDOMINAL 8X10 ST (GAUZE/BANDAGES/DRESSINGS) ×6 IMPLANT
DRSG TEGADERM 4X4.75 (GAUZE/BANDAGES/DRESSINGS) ×1 IMPLANT
DURAPREP 26ML APPLICATOR (WOUND CARE) ×2 IMPLANT
ELECT MENISCUS 165MM 90D (ELECTRODE) IMPLANT
ELECT REM PT RETURN 9FT ADLT (ELECTROSURGICAL) ×2
ELECTRODE REM PT RTRN 9FT ADLT (ELECTROSURGICAL) ×1 IMPLANT
FILTER STRAW FLUID ASPIR (MISCELLANEOUS) ×2 IMPLANT
GAUZE SPONGE 4X4 12PLY STRL (GAUZE/BANDAGES/DRESSINGS) ×2 IMPLANT
GAUZE XEROFORM 1X8 LF (GAUZE/BANDAGES/DRESSINGS) ×2 IMPLANT
GLOVE BIO SURGEON ST LM GN SZ9 (GLOVE) ×2 IMPLANT
GLOVE BIOGEL PI IND STRL 8 (GLOVE) ×1 IMPLANT
GLOVE BIOGEL PI INDICATOR 8 (GLOVE) ×1
GLOVE BIOGEL PI ORTHO PRO SZ7 (GLOVE) ×1
GLOVE PI ORTHO PRO STRL SZ7 (GLOVE) IMPLANT
GLOVE SURG ORTHO 8.0 STRL STRW (GLOVE) ×2 IMPLANT
GLOVE SURG SS PI 8.0 STRL IVOR (GLOVE) ×1 IMPLANT
GOWN STRL REUS W/ TWL LRG LVL3 (GOWN DISPOSABLE) ×3 IMPLANT
GOWN STRL REUS W/TWL LRG LVL3 (GOWN DISPOSABLE) ×6
KIT BASIN OR (CUSTOM PROCEDURE TRAY) ×2 IMPLANT
KIT BIO-TENODESIS 3X8 DISP (MISCELLANEOUS) ×2
KIT INSRT BABSR STRL DISP BTN (MISCELLANEOUS) IMPLANT
KIT JUGGERKNOT DISP 2.9MM (KITS) ×1 IMPLANT
KIT ROOM TURNOVER OR (KITS) ×2 IMPLANT
MANIFOLD NEPTUNE II (INSTRUMENTS) ×2 IMPLANT
NDL HYPO 25X1 1.5 SAFETY (NEEDLE) ×1 IMPLANT
NDL SPNL 18GX3.5 QUINCKE PK (NEEDLE) ×1 IMPLANT
NDL SUT 6 .5 CRC .975X.05 MAYO (NEEDLE) ×1 IMPLANT
NEEDLE HYPO 25X1 1.5 SAFETY (NEEDLE) ×2 IMPLANT
NEEDLE MAYO TAPER (NEEDLE) ×4
NEEDLE SPNL 18GX3.5 QUINCKE PK (NEEDLE) ×2 IMPLANT
NS IRRIG 1000ML POUR BTL (IV SOLUTION) ×2 IMPLANT
PACK SHOULDER (CUSTOM PROCEDURE TRAY) ×2 IMPLANT
PACK UNIVERSAL I (CUSTOM PROCEDURE TRAY) ×2 IMPLANT
PAD ARMBOARD 7.5X6 YLW CONV (MISCELLANEOUS) ×4 IMPLANT
SCREW TENODESIS BIOCOMP 7MM ×1 IMPLANT
SCREW TENODESIS BIOCOMP 7MM (Screw) ×1 IMPLANT
SET ARTHROSCOPY TUBING (MISCELLANEOUS) ×2
SET ARTHROSCOPY TUBING LN (MISCELLANEOUS) ×1 IMPLANT
SLING ARM IMMOBILIZER MED (SOFTGOODS) IMPLANT
SPEAR FASTAKII (SLEEVE) IMPLANT
SPONGE LAP 4X18 X RAY DECT (DISPOSABLE) ×4 IMPLANT
STRIP CLOSURE SKIN 1/2X4 (GAUZE/BANDAGES/DRESSINGS) ×2 IMPLANT
SUCTION FRAZIER TIP 10 FR DISP (SUCTIONS) ×2 IMPLANT
SUT ETHILON 3 0 PS 1 (SUTURE) ×2 IMPLANT
SUT FIBERWIRE 2-0 18 17.9 3/8 (SUTURE)
SUT MNCRL AB 3-0 PS2 18 (SUTURE) ×1 IMPLANT
SUT PROLENE 3 0 PS 2 (SUTURE) ×2 IMPLANT
SUT VIC AB 0 CT1 27 (SUTURE) ×6
SUT VIC AB 0 CT1 27XBRD ANBCTR (SUTURE) ×2 IMPLANT
SUT VIC AB 1 CT1 27 (SUTURE) ×2
SUT VIC AB 1 CT1 27XBRD ANBCTR (SUTURE) ×1 IMPLANT
SUT VIC AB 2-0 CT1 27 (SUTURE) ×2
SUT VIC AB 2-0 CT1 TAPERPNT 27 (SUTURE) ×1 IMPLANT
SUT VICRYL 0 UR6 27IN ABS (SUTURE) IMPLANT
SUTURE FIBERWR 2-0 18 17.9 3/8 (SUTURE) IMPLANT
SYR 20CC LL (SYRINGE) ×4 IMPLANT
SYR 3ML LL SCALE MARK (SYRINGE) ×2 IMPLANT
SYR TB 1ML LUER SLIP (SYRINGE) ×2 IMPLANT
TOWEL OR 17X24 6PK STRL BLUE (TOWEL DISPOSABLE) ×2 IMPLANT
TOWEL OR 17X26 10 PK STRL BLUE (TOWEL DISPOSABLE) ×2 IMPLANT
WAND HAND CNTRL MULTIVAC 90 (MISCELLANEOUS) IMPLANT
WATER STERILE IRR 1000ML POUR (IV SOLUTION) ×2 IMPLANT

## 2015-06-17 NOTE — Addendum Note (Signed)
Addendum  created 06/17/15 2022 by Fidela Juneau, CRNA   Modules edited: Anesthesia Events, Narrator   Narrator:  Narrator: Event Log Edited

## 2015-06-17 NOTE — Op Note (Signed)
Troy Gentry, Troy Gentry NO.:  [id number]  MEDICAL RECORD NO.:  [id number]  LOCATION:  [facility]                         FACILITY:  [facility]  PHYSICIAN:  Anderson Malta, M.D.    DATE OF BIRTH:  1971/01/13  DATE OF PROCEDURE: DATE OF DISCHARGE:  06/17/2015                              OPERATIVE REPORT   PREOPERATIVE DIAGNOSIS:  Right shoulder partial subscapularis tear with medial subluxation of the biceps tendon.  POSTOPERATIVE DIAGNOSIS:  Right shoulder partial subscapularis tear with medial subluxation of the biceps tendon.  PROCEDURES:  Right shoulder diagnostic arthroscopy, biceps tendon release, open biceps tenodesis with repair of the upper subscapularis.  SURGEON:  Anderson Malta, M.D.  ASSISTANT:  Johnell Comings.  ANESTHESIA:  General.  INDICATIONS:  Troy Gentry is a 45 year old patient with right shoulder pain refractory to nonoperative management.  MRI scan shows subluxation of the biceps tendon medially, presents for operative management after explanation of the risks and benefits.  PROCEDURE IN DETAIL:  The patient was brought to the operating room where general endotracheal anesthesia was introduced.  Preoperative antibiotics were administered.  Time-out was called.  Right shoulder exam under anesthesia, found to have excellent stability anterior and posterior with full range of motion.  External rotation was 15 degrees with abduction was about 60 degrees, full forward flexion and abduction present.  After examination under anesthesia, the patient was placed in beach-chair position, head in neutral position.  Right shoulder was prescrubbed with alcohol and Betadine, allowed to air dry, prepped with DuraPrep solution and draped in a sterile manner.  Charlie Pitter was used to cover under the axilla.  Solution of saline and epinephrine injected into the subacromial space.  Posterior portal created 2 cm medial and inferior to the posterolateral margin of the  acromion.  Diagnostic arthroscopy was performed.  Anterior portal created under direct visualization.  Rotator cuff was intact.  The biceps anchor was actually stable, but the biceps tendon itself was subluxated.  Partial tearing of the upper subscap was present.  There was split longitudinal tearing in accordance with the MRI scan of the biceps tendon.  Biceps tendon was released.  Anterior-inferior, posterior-inferior glenohumeral articular surfaces were intact.  Following biceps tendon release, instruments were removed.  Anterior and posterior closed using 3-0 nylon.  Charlie Pitter was used to cover the rest of the shoulder, about a 1.5-inch incision made off the anterolateral margin of the acromion.  Skin and subcutaneous tissue were sharply divided.  Stay suture placed at the split between the raphe between the anterior middle deltoid about 3 cm from the anterolateral margin of the acromion.  Bursectomy performed, biceps tendon then tenodesed using 7-mm Arthrex Bio-tenodesis screw in the upper portion of the bicipital groove.  The upper subscapularis was then tacked down using a single size 2 JuggerKnot suture.  Good fixation was achieved. Both the biceps tendon and the subscap upper portion where the biceps tendon had been subluxated.  Following this, thorough irrigation was performed.  Deltoid split closed using 0 Vicryl suture followed by interrupted inverted 2-0 Vicryl suture and a 3-0 Monocryl. Waterproof dressing was applied.  The patient tolerated the procedure well without immediate complication, transferred  to the recovery room in stable condition.     Anderson Malta, M.D.     GSD/MEDQ  D:  06/17/2015  T:  06/17/2015  Job:  [id number]

## 2015-06-17 NOTE — Anesthesia Postprocedure Evaluation (Signed)
Anesthesia Post Note  Patient: Troy Gentry  Procedure(s) Performed: Procedure(s) (LRB): DIAGNOSTIC OPERATIVE RIGHT SHOULDER ARTHROSCOPY, BICEPS RELEASE WITH BICEPS TENODESIS, UPPER SUBSCAPULARIS REPAIR (Right)  Patient location during evaluation: PACU Anesthesia Type: General and Regional Level of consciousness: awake and awake and alert Pain management: pain level controlled Vital Signs Assessment: post-procedure vital signs reviewed and stable Respiratory status: spontaneous breathing and nonlabored ventilation Anesthetic complications: no    Last Vitals:  Filed Vitals:   06/17/15 1515 06/17/15 1519  BP:  115/73  Pulse: 63 67  Temp:  36.1 C  Resp: 16 16    Last Pain:  Filed Vitals:   06/17/15 1523  PainSc: 0-No pain                 [name]

## 2015-06-17 NOTE — Brief Op Note (Signed)
06/17/2015  1:43 PM  PATIENT:  Troy Gentry  45 y.o. male  PRE-OPERATIVE DIAGNOSIS:  RIGHT SHOULDER BICEPS SUBLUXATION, PARTIAL SUBSCAPULARIS TEAR  POST-OPERATIVE DIAGNOSIS:  RIGHT SHOULDER BICEPS SUBLUXATION, PARTIAL SUBSCAPULARIS TEAR  PROCEDURE:  Procedure(s): DIAGNOSTIC OPERATIVE RIGHT SHOULDER ARTHROSCOPY, BICEPS RELEASE WITH BICEPS TENODESIS, UPPER SUBSCAPULARIS REPAIR  SURGEON:  Surgeon(s): Meredith Pel, MD  ASSISTANT: Johnell Comings  ANESTHESIA:   general  EBL: 25 ml    Total I/O In: 2000 [I.V.:2000] Out: -   BLOOD ADMINISTERED: none  DRAINS: none   LOCAL MEDICATIONS USED:  none  SPECIMEN:  No Specimen  COUNTS:  YES  TOURNIQUET:  * No tourniquets in log *  DICTATION: .Other Dictation: Dictation Number [id number]  PLAN OF CARE: Discharge to home after PACU  PATIENT DISPOSITION:  PACU - hemodynamically stable

## 2015-06-17 NOTE — Anesthesia Preprocedure Evaluation (Signed)
Anesthesia Evaluation  Patient identified by MRN, date of birth, ID band Patient awake    Reviewed: Allergy & Precautions, NPO status , Patient's Chart, lab work & pertinent test results  Airway Mallampati: I       Dental   Pulmonary neg pulmonary ROS,    breath sounds clear to auscultation       Cardiovascular negative cardio ROS   Rhythm:Regular Rate:Normal     Neuro/Psych    GI/Hepatic negative GI ROS, Neg liver ROS,   Endo/Other  negative endocrine ROS  Renal/GU negative Renal ROS     Musculoskeletal   Abdominal   Peds  Hematology negative hematology ROS (+)   Anesthesia Other Findings   Reproductive/Obstetrics                             Anesthesia Physical Anesthesia Plan  ASA: I  Anesthesia Plan: General   Post-op Pain Management: GA combined w/ Regional for post-op pain   Induction: Intravenous  Airway Management Planned: Oral ETT  Additional Equipment:   Intra-op Plan:   Post-operative Plan: Extubation in OR  Informed Consent: I have reviewed the patients History and Physical, chart, labs and discussed the procedure including the risks, benefits and alternatives for the proposed anesthesia with the patient or authorized representative who has indicated his/her understanding and acceptance.   Dental advisory given  Plan Discussed with:   Anesthesia Plan Comments:         Anesthesia Quick Evaluation

## 2015-06-17 NOTE — Interval H&P Note (Signed)
History and Physical Interval Note:  06/17/2015 7:28 AM  Troy Gentry  has presented today for surgery, with the diagnosis of RIGHT SHOULDER BICEPS SUBLUXATION, PARTIAL SUBSCAPULARIS TEAR  The various methods of treatment have been discussed with the patient and family. After consideration of risks, benefits and other options for treatment, the patient has consented to  Procedure(s): SHOULDER DIAGNOSTIC OPERATIVE ARTHROSCOPY, BICEPS RELEASE WITH BICEPS TENODESIS, UPPER SUBSCAPULARIS REPAIR (Right) as a surgical intervention .  The patient's history has been reviewed, patient examined, no change in status, stable for surgery.  I have reviewed the patient's chart and labs.  Questions were answered to the patient's satisfaction.     Dajion Bickford SCOTT

## 2015-06-17 NOTE — Anesthesia Procedure Notes (Addendum)
Anesthesia Regional Block:  Interscalene brachial plexus block  Pre-Anesthetic Checklist: ,, timeout performed, Correct Patient, Correct Site, Correct Laterality, Correct Procedure, Correct Position, risks and benefits discussed,, pre-op evaluation, at surgeon's request and post-op pain management  Laterality: Upper and Right  Prep: Betadine, chloraprep and alcohol swabs       Needles:  Injection technique: Single-shot  Needle Type: Echogenic Stimulator Needle     Needle Length: 9cm 9 cm Needle Gauge: 22 and 22 G  Needle insertion depth: 3 cm   Additional Needles:  Procedures: ultrasound guided (picture in chart) and nerve stimulator Interscalene brachial plexus block  Nerve Stimulator or Paresthesia:  Response: Twitch elicited, 0.5 mA, 0.3 ms,   Additional Responses:   Narrative:  Start time: 06/17/2015 10:10 AM End time: 06/17/2015 10:25 AM Injection made incrementally with aspirations every 5 mL.  Performed by: Personally  Anesthesiologist: MASSAGEE, TERRY  Additional Notes: Block assessed prior to start of surgery   Procedure Name: Intubation Date/Time: 06/17/2015 11:47 AM Performed by: Collier Bullock Pre-anesthesia Checklist: Patient identified, Emergency Drugs available, Suction available and Patient being monitored Patient Re-evaluated:Patient Re-evaluated prior to inductionOxygen Delivery Method: Circle system utilized Preoxygenation: Pre-oxygenation with 100% oxygen Intubation Type: IV induction Ventilation: Mask ventilation without difficulty Laryngoscope Size: Mac and 3 Grade View: Grade I Tube type: Oral Number of attempts: 1 Airway Equipment and Method: Stylet Placement Confirmation: ETT inserted through vocal cords under direct vision,  positive ETCO2 and breath sounds checked- equal and bilateral Secured at: 25 cm Tube secured with: Tape Dental Injury: Teeth and Oropharynx as per pre-operative assessment

## 2015-06-17 NOTE — Transfer of Care (Signed)
Immediate Anesthesia Transfer of Care Note  Patient: Troy Gentry  Procedure(s) Performed: Procedure(s): DIAGNOSTIC OPERATIVE RIGHT SHOULDER ARTHROSCOPY, BICEPS RELEASE WITH BICEPS TENODESIS, UPPER SUBSCAPULARIS REPAIR (Right)  Patient Location: PACU  Anesthesia Type:GA combined with regional for post-op pain  Level of Consciousness: awake, alert  and oriented  Airway & Oxygen Therapy: Patient Spontanous Breathing and Patient connected to nasal cannula oxygen  Post-op Assessment: Report given to RN and Post -op Vital signs reviewed and stable  Post vital signs: Reviewed and stable  Last Vitals:  Filed Vitals:   06/17/15 0934 06/17/15 1401  BP: 129/88 118/66  Pulse: 64 84  Temp: 36.5 C 36.4 C  Resp: 18 19    Complications: No apparent anesthesia complications

## 2015-06-18 ENCOUNTER — Encounter (HOSPITAL_COMMUNITY): Payer: Self-pay | Admitting: Orthopedic Surgery

## 2015-07-10 ENCOUNTER — Encounter: Payer: Self-pay | Admitting: Internal Medicine

## 2015-07-10 ENCOUNTER — Ambulatory Visit (INDEPENDENT_AMBULATORY_CARE_PROVIDER_SITE_OTHER): Payer: Managed Care, Other (non HMO) | Admitting: Internal Medicine

## 2015-07-10 VITALS — BP 110/68 | HR 70 | Ht 71.0 in | Wt 197.0 lb

## 2015-07-10 DIAGNOSIS — Z1211 Encounter for screening for malignant neoplasm of colon: Secondary | ICD-10-CM | POA: Diagnosis not present

## 2015-07-10 DIAGNOSIS — Z800 Family history of malignant neoplasm of digestive organs: Secondary | ICD-10-CM | POA: Diagnosis not present

## 2015-07-10 MED ORDER — NA SULFATE-K SULFATE-MG SULF 17.5-3.13-1.6 GM/177ML PO SOLN: 1.0000 | Freq: Once | ORAL | Status: DC

## 2015-07-10 NOTE — Patient Instructions (Signed)

## 2015-07-10 NOTE — Progress Notes (Signed)
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 Shon Baton regarding screening colonoscopy. Patient has a family history of colon cancer in his grandfather and what sounds like malignant colon polyp in his father as well as colon cancer in his uncle. Patient's GI review of systems negative. No other malignancies in the family to report. Outside blood work from October 2016 has been reviewed with unremarkable CBC and comprehensive metabolic panel.  REVIEW OF SYSTEMS:  All non-GI ROS negative except for shoulder pain  Past Medical History  Diagnosis Date  . Avulsion of hamstring muscle 2012    Past Surgical History  Procedure Laterality Date  . Knee arthroscopy w/ acl reconstruction      '95, '06 left knee  . Eye surgery      Lasik  . Shoulder arthroscopy Right 06/17/2015    Procedure: DIAGNOSTIC OPERATIVE RIGHT SHOULDER ARTHROSCOPY, BICEPS RELEASE WITH BICEPS TENODESIS, UPPER SUBSCAPULARIS REPAIR;  Surgeon: Meredith Pel, MD;  Location: [facility];  Service: Orthopedics;  Laterality: Right;    Social History KELSY MURROW  reports that he has never smoked. He has never used smokeless tobacco. He reports that he drinks about 4.2 - 4.8 oz of alcohol per week. He reports that he does not use illicit drugs.  family history includes Arthritis in his mother; Cancer in his paternal grandfather, paternal grandmother, and paternal uncle; Colitis in his sister; Diabetes in his paternal grandmother and sister; Heart disease in his father and maternal grandfather; Hyperlipidemia in his father; Hypertension in his father.  No Known Allergies     PHYSICAL EXAMINATION: Vital signs: BP 110/68 mmHg  Pulse 70  Ht 5\' 11"  (1.803 m)  Wt 197 lb (89.359 kg)  BMI 27.49 kg/m2  Constitutional: generally well-appearing, no acute distress Psychiatric: alert and oriented x3, cooperative Eyes: extraocular movements  intact, anicteric, conjunctiva pink Mouth: oral pharynx moist, no lesions Neck: supple no lymphadenopathy Cardiovascular: heart regular rate and rhythm, no murmur Lungs: clear to auscultation bilaterally Abdomen: soft, nontender, nondistended, no obvious ascites, no peritoneal signs, normal bowel sounds, no organomegaly Rectal: Deferred until colonoscopy Extremities: no clubbing cyanosis or lower extremity edema bilaterally Skin: no lesions on visible extremities Neuro: No focal deficits.  ASSESSMENT:  #1. Colon cancer screening. High risk secondary to strong family history as outlined. The patient is interested in proceeding with colonoscopy at this time. This is reasonable   PLAN:  #1. Screening colonoscopy with polypectomy if necessary.The nature of the procedure, as well as the risks, benefits, and alternatives were carefully and thoroughly reviewed with the patient. Ample time for discussion and questions allowed. The patient understood, was satisfied, and agreed to proceed.  A copy of his consultation note has been sent to Dr. Virgina Jock

## 2015-09-20 ENCOUNTER — Encounter: Payer: Managed Care, Other (non HMO) | Admitting: Internal Medicine

## 2015-10-01 ENCOUNTER — Encounter: Payer: Self-pay | Admitting: Internal Medicine

## 2015-10-11 ENCOUNTER — Telehealth: Payer: Self-pay | Admitting: Internal Medicine

## 2015-10-11 NOTE — Telephone Encounter (Signed)
Reviewed prep instructions with pt and questions were answered.

## 2015-10-14 ENCOUNTER — Ambulatory Visit (AMBULATORY_SURGERY_CENTER): Payer: Managed Care, Other (non HMO) | Admitting: Internal Medicine

## 2015-10-14 ENCOUNTER — Encounter: Payer: Self-pay | Admitting: Internal Medicine

## 2015-10-14 VITALS — BP 114/60 | HR 66 | Temp 97.7°F | Resp 16 | Ht 71.0 in | Wt 197.0 lb

## 2015-10-14 DIAGNOSIS — Z800 Family history of malignant neoplasm of digestive organs: Secondary | ICD-10-CM | POA: Diagnosis not present

## 2015-10-14 DIAGNOSIS — Z1211 Encounter for screening for malignant neoplasm of colon: Secondary | ICD-10-CM

## 2015-10-14 DIAGNOSIS — D123 Benign neoplasm of transverse colon: Secondary | ICD-10-CM

## 2015-10-14 MED ORDER — SODIUM CHLORIDE 0.9 % IV SOLN: 500.0000 mL | INTRAVENOUS | Status: DC

## 2015-10-14 NOTE — Patient Instructions (Signed)
YOU HAD AN ENDOSCOPIC PROCEDURE TODAY AT [facility]:   Refer to the procedure report that was given to you for any specific questions about what was found during the examination.  If the procedure report does not answer your questions, please call your gastroenterologist to clarify.  If you requested that your care partner not be given the details of your procedure findings, then the procedure report has been included in a sealed envelope for you to review at your convenience later.  YOU SHOULD EXPECT: Some feelings of bloating in the abdomen. Passage of more gas than usual.  Walking can help get rid of the air that was put into your GI tract during the procedure and reduce the bloating. If you had a lower endoscopy (such as a colonoscopy or flexible sigmoidoscopy) you may notice spotting of blood in your stool or on the toilet paper. If you underwent a bowel prep for your procedure, you may not have a normal bowel movement for a few days.  Please Note:  You might notice some irritation and congestion in your nose or some drainage.  This is from the oxygen used during your procedure.  There is no need for concern and it should clear up in a day or so.  SYMPTOMS TO REPORT IMMEDIATELY:   Following lower endoscopy (colonoscopy or flexible sigmoidoscopy):  Excessive amounts of blood in the stool  Significant tenderness or worsening of abdominal pains  Swelling of the abdomen that is new, acute  Fever of 100F or higher  For urgent or emergent issues, a gastroenterologist can be reached at any hour by calling [phone].   DIET: Your first meal following the procedure should be a small meal and then it is ok to progress to your normal diet. Heavy or fried foods are harder to digest and may make you feel nauseous or bloated.  Likewise, meals heavy in dairy and vegetables can increase bloating.  Drink plenty of fluids but you should avoid alcoholic beverages for 24  hours.  ACTIVITY:  You should plan to take it easy for the rest of today and you should NOT DRIVE or use heavy machinery until tomorrow (because of the sedation medicines used during the test).    FOLLOW UP: Our staff will call the number listed on your records the next business day following your procedure to check on you and address any questions or concerns that you may have regarding the information given to you following your procedure. If we do not reach you, we will leave a message.  However, if you are feeling well and you are not experiencing any problems, there is no need to return our call.  We will assume that you have returned to your regular daily activities without incident.  If any biopsies were taken you will be contacted by phone or by letter within the next 1-3 weeks.  Please call us at [phone] if you have not heard about the biopsies in 3 weeks.    SIGNATURES/CONFIDENTIALITY: You and/or your care partner have signed paperwork which will be entered into your electronic medical record.  These signatures attest to the fact that that the information above on your After Visit Summary has been reviewed and is understood.  Full responsibility of the confidentiality of this discharge information lies with you and/or your care-partner.  Please review polyp and hemorrhoid handouts provided.

## 2015-10-14 NOTE — Op Note (Signed)
[facility] Patient Name: Troy Gentry Procedure Date: 10/14/2015 1:53 PM MRN: [id number] Endoscopist: Docia Chuck. Henrene Pastor , MD Age: 45 Referring MD:  Date of Birth: Jun 08, 1970 Gender: Male Procedure:                Colonoscopy, with cold snare polypectomy -1 Indications:              Colon cancer screening in patient at increased                            risk: Family history of colorectal cancer in                            multiple 2nd degree relatives. GP, uncle, and                            father possibly with malignant colon polyp Medicines:                Monitored Anesthesia Care Procedure:                Pre-Anesthesia Assessment:                           - Prior to the procedure, a History and Physical                            was performed, and patient medications and                            allergies were reviewed. The patient's tolerance of                            previous anesthesia was also reviewed. The risks                            and benefits of the procedure and the sedation                            options and risks were discussed with the patient.                            All questions were answered, and informed consent                            was obtained. Prior Anticoagulants: The patient has                            taken no previous anticoagulant or antiplatelet                            agents. ASA Grade Assessment: I - A normal, healthy                            patient. After reviewing the risks and benefits,  the patient was deemed in satisfactory condition to                            undergo the procedure.                           After obtaining informed consent, the colonoscope                            was passed under direct vision. Throughout the                            procedure, the patient's blood pressure, pulse, and                            oxygen saturations were monitored  continuously. The                            Model CF-HQ190L [id number]) scope was introduced                            through the anus and advanced to the the cecum,                            identified by appendiceal orifice and ileocecal                            valve. The ileocecal valve, appendiceal orifice,                            and rectum were photographed. The quality of the                            bowel preparation was excellent. The colonoscopy                            was performed without difficulty. The patient                            tolerated the procedure well. The bowel preparation                            used was SUPREP. Scope In: 2:05:17 PM Scope Out: 2:22:12 PM Scope Withdrawal Time: 0 hours 14 minutes 54 seconds  Total Procedure Duration: 0 hours 16 minutes 55 seconds  Findings:                 The digital rectal exam was normal.                           A 8 mm polyp was found in the hepatic flexure. The                            polyp was sessile. The polyp was removed with a  cold snare. Resection and retrieval were complete.                           Internal hemorrhoids were found during                            retroflexion. The hemorrhoids were small.                           The exam was otherwise without abnormality on                            direct and retroflexion views. Complications:            No immediate complications. Estimated blood loss:                            None. Estimated Blood Loss:     Estimated blood loss: none. Impression:               - One 8 mm polyp at the hepatic flexure, removed                            with a cold snare. Resected and retrieved.                           - Internal hemorrhoids.                           - The examination was otherwise normal on direct                            and retroflexion views. Recommendation:           - Repeat colonoscopy in 5  years for surveillance.                           - Await pathology results. Docia Chuck. Henrene Pastor, MD 10/14/2015 2:32:49 PM This report has been signed electronically. CC Letter to:             Shon Baton, MD

## 2015-10-14 NOTE — Progress Notes (Signed)
Report given to PACU RN, vss 

## 2015-10-14 NOTE — Progress Notes (Signed)
Called to room to assist during endoscopic procedure.  Patient ID and intended procedure confirmed with present staff. Received instructions for my participation in the procedure from the performing physician.  

## 2015-10-15 ENCOUNTER — Telehealth: Payer: Self-pay | Admitting: *Deleted

## 2015-10-15 NOTE — Telephone Encounter (Signed)
  Follow up Call-  Call back number 10/14/2015  Post procedure Call Back phone  # [phone]  Permission to leave phone message Yes     Patient questions:  Do you have a fever, pain , or abdominal swelling? No. Pain Score  0 *  Have you tolerated food without any problems? Yes.    Have you been able to return to your normal activities? Yes.    Do you have any questions about your discharge instructions: Diet   No. Medications  No. Follow up visit  No.  Do you have questions or concerns about your Care? No.  Actions: * If pain score is 4 or above: No action needed, pain <4.

## 2015-10-18 ENCOUNTER — Encounter: Payer: Self-pay | Admitting: Internal Medicine

## 2017-01-18 ENCOUNTER — Telehealth (INDEPENDENT_AMBULATORY_CARE_PROVIDER_SITE_OTHER): Payer: Self-pay | Admitting: Orthopedic Surgery

## 2017-01-18 NOTE — Telephone Encounter (Signed)
Returned call to patient left message for call back  [phone]

## 2017-01-19 ENCOUNTER — Telehealth (INDEPENDENT_AMBULATORY_CARE_PROVIDER_SITE_OTHER): Payer: Self-pay

## 2017-01-19 NOTE — Telephone Encounter (Signed)
I tried calling. No answer. LMVM advising was returning call to discuss.

## 2017-01-19 NOTE — Telephone Encounter (Signed)
Patient would like to know if you can ask Dr. Marlou Sa if he can be worked in for an earlier day and time.  Patient has an appointment scheduled for 01/28/17.

## 2017-01-28 ENCOUNTER — Ambulatory Visit (INDEPENDENT_AMBULATORY_CARE_PROVIDER_SITE_OTHER): Payer: Managed Care, Other (non HMO) | Admitting: Orthopedic Surgery

## 2017-01-28 ENCOUNTER — Encounter (INDEPENDENT_AMBULATORY_CARE_PROVIDER_SITE_OTHER): Payer: Self-pay | Admitting: Orthopedic Surgery

## 2017-01-28 ENCOUNTER — Ambulatory Visit (INDEPENDENT_AMBULATORY_CARE_PROVIDER_SITE_OTHER): Payer: Self-pay

## 2017-01-28 DIAGNOSIS — M79671 Pain in right foot: Secondary | ICD-10-CM

## 2017-01-29 LAB — URIC ACID: URIC ACID, SERUM: 8.3 mg/dL — AB (ref 4.0–8.0)

## 2017-01-31 NOTE — Progress Notes (Signed)
Office Visit Note   Patient: Troy Gentry           Date of Birth: 08/25/1970           MRN: [id number] Visit Date: 01/28/2017 Requested by: Shon Baton, [address] [place], [place] [address] PCP: Shon Baton, MD  Subjective: Chief Complaint  Patient presents with  . Right Foot - Pain    HPI: [name] is a 46 year old patient with right foot pain.  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  Denies any history of injury to the right foot.  Localizes pain to the fifth metatarsal base region.  Describes pain at rest.  34 months ago he had an episode where the foot was very painful and he had pain at rest and at night but it got better.  The same thing recurred about a week ago where the pain woke him from sleep at night.  Now it is not as painful.  Patient's father does have a history of gout.  Ibuprofen helps his symptoms.  He denies any new back pain.  Once every 18 months he has relatively bad back pain which resolves.  He is currently on a low carbohydrate diet.              ROS: All systems reviewed are negative as they relate to the chief complaint within the history of present illness.  Patient denies  fevers or chills.   Assessment & Plan: Visit Diagnoses:  1. Right foot pain     Plan: Impression is episodic right foot pain in a patient with normal radiographs and a family history of gout.  This looks like gout or pseudogout based on the presence of pain is waking him from sleep at night as well as the episodic nature of the pain.  Uric acid level drawn is elevated at 8.3.  I did give him a prescription and samples of colchicine to take which would be diagnostic to some degree if it improves further symptoms that Nosson has.  I have communicated the results of the lab values with Vonna Kotyk.  Follow-Up Instructions: Return if symptoms worsen or fail to improve.   Orders:  Orders Placed This Encounter  Procedures  . XR Foot Complete Right  . Uric acid   No orders of the  defined types were placed in this encounter.     Procedures: No procedures performed   Clinical Data: No additional findings.  Objective: Vital Signs: There were no vitals taken for this visit.  Physical Exam:   Constitutional: Patient appears well-developed HEENT:  Head: Normocephalic Eyes:EOM are normal Neck: Normal range of motion Cardiovascular: Normal rate Pulmonary/chest: Effort normal Neurologic: Patient is alert Skin: Skin is warm Psychiatric: Patient has normal mood and affect    Ortho Exam: Orthopedic exam demonstrates normal gait alignment operable nontender intact intra-to posterior tib peroneal and Achilles tendons.  No other masses lymph adenopathy or skin changes in the right foot region.  No pain with pronation supination of the foot.  No warmth and no swelling.  Specialty Comments:  No specialty comments available.  Imaging: No results found.   PMFS History: Patient Active Problem List   Diagnosis Date Noted  . Routine health maintenance 02/20/2012   Past Medical History:  Diagnosis Date  . Avulsion of hamstring muscle 2012    Family History  Problem Relation Age of Onset  . Arthritis Mother   . Heart disease Father   . Hypertension Father   .  Hyperlipidemia Father   . Colitis Sister   . Cancer Paternal Uncle        colon  . Cancer Paternal Grandmother        colon  . Diabetes Paternal Grandmother   . Cancer Paternal Grandfather        colon  . Colon cancer Paternal Grandfather   . Diabetes Sister   . Heart disease Maternal Grandfather        CAD    Past Surgical History:  Procedure Laterality Date  . EYE SURGERY     Lasik  . KNEE ARTHROSCOPY W/ ACL RECONSTRUCTION     '95, '06 left knee  . SHOULDER ARTHROSCOPY Right 06/17/2015   Procedure: DIAGNOSTIC OPERATIVE RIGHT SHOULDER ARTHROSCOPY, BICEPS RELEASE WITH BICEPS TENODESIS, UPPER SUBSCAPULARIS REPAIR;  Surgeon: Meredith Pel, MD;  Location: [facility];  Service: Orthopedics;   Laterality: Right;   Social History   Occupational History  . business Freight forwarder    Social History Main Topics  . Smoking status: Never Smoker  . Smokeless tobacco: Never Used  . Alcohol use 4.2 - 4.8 oz/week    3 Shots of liquor, 4 - 5 Standard drinks or equivalent per week  . Drug use: No  . Sexual activity: Yes    Partners: Female

## 2017-02-16 DIAGNOSIS — Z23 Encounter for immunization: Secondary | ICD-10-CM | POA: Diagnosis not present

## 2017-03-22 DIAGNOSIS — Z125 Encounter for screening for malignant neoplasm of prostate: Secondary | ICD-10-CM | POA: Diagnosis not present

## 2017-03-22 DIAGNOSIS — E7849 Other hyperlipidemia: Secondary | ICD-10-CM | POA: Diagnosis not present

## 2017-03-22 DIAGNOSIS — Z0000 Encounter for general adult medical examination without abnormal findings: Secondary | ICD-10-CM | POA: Diagnosis not present

## 2017-03-22 DIAGNOSIS — R8290 Unspecified abnormal findings in urine: Secondary | ICD-10-CM | POA: Diagnosis not present

## 2017-03-29 DIAGNOSIS — Z1389 Encounter for screening for other disorder: Secondary | ICD-10-CM | POA: Diagnosis not present

## 2017-03-29 DIAGNOSIS — Z0000 Encounter for general adult medical examination without abnormal findings: Secondary | ICD-10-CM | POA: Diagnosis not present

## 2017-03-29 DIAGNOSIS — E790 Hyperuricemia without signs of inflammatory arthritis and tophaceous disease: Secondary | ICD-10-CM | POA: Diagnosis not present

## 2017-03-29 DIAGNOSIS — E7849 Other hyperlipidemia: Secondary | ICD-10-CM | POA: Diagnosis not present

## 2017-03-29 DIAGNOSIS — Z125 Encounter for screening for malignant neoplasm of prostate: Secondary | ICD-10-CM | POA: Diagnosis not present

## 2017-03-29 DIAGNOSIS — K635 Polyp of colon: Secondary | ICD-10-CM | POA: Diagnosis not present

## 2017-03-29 DIAGNOSIS — D72819 Decreased white blood cell count, unspecified: Secondary | ICD-10-CM | POA: Diagnosis not present

## 2017-04-05 DIAGNOSIS — Z1212 Encounter for screening for malignant neoplasm of rectum: Secondary | ICD-10-CM | POA: Diagnosis not present

## 2017-04-28 DIAGNOSIS — Z0100 Encounter for examination of eyes and vision without abnormal findings: Secondary | ICD-10-CM | POA: Diagnosis not present

## 2018-03-10 DIAGNOSIS — Z23 Encounter for immunization: Secondary | ICD-10-CM | POA: Diagnosis not present

## 2018-03-31 DIAGNOSIS — Z6826 Body mass index (BMI) 26.0-26.9, adult: Secondary | ICD-10-CM | POA: Diagnosis not present

## 2018-03-31 DIAGNOSIS — Z0000 Encounter for general adult medical examination without abnormal findings: Secondary | ICD-10-CM | POA: Diagnosis not present

## 2018-03-31 DIAGNOSIS — Z125 Encounter for screening for malignant neoplasm of prostate: Secondary | ICD-10-CM | POA: Diagnosis not present

## 2018-03-31 DIAGNOSIS — R82998 Other abnormal findings in urine: Secondary | ICD-10-CM | POA: Diagnosis not present

## 2018-03-31 DIAGNOSIS — R509 Fever, unspecified: Secondary | ICD-10-CM | POA: Diagnosis not present

## 2018-03-31 DIAGNOSIS — J189 Pneumonia, unspecified organism: Secondary | ICD-10-CM | POA: Diagnosis not present

## 2018-03-31 DIAGNOSIS — E7849 Other hyperlipidemia: Secondary | ICD-10-CM | POA: Diagnosis not present

## 2018-03-31 DIAGNOSIS — R05 Cough: Secondary | ICD-10-CM | POA: Diagnosis not present

## 2018-04-06 DIAGNOSIS — D696 Thrombocytopenia, unspecified: Secondary | ICD-10-CM | POA: Diagnosis not present

## 2018-04-06 DIAGNOSIS — D72819 Decreased white blood cell count, unspecified: Secondary | ICD-10-CM | POA: Diagnosis not present

## 2018-04-06 DIAGNOSIS — R7989 Other specified abnormal findings of blood chemistry: Secondary | ICD-10-CM | POA: Diagnosis not present

## 2018-04-06 DIAGNOSIS — Z1389 Encounter for screening for other disorder: Secondary | ICD-10-CM | POA: Diagnosis not present

## 2018-04-06 DIAGNOSIS — E7849 Other hyperlipidemia: Secondary | ICD-10-CM | POA: Diagnosis not present

## 2018-04-06 DIAGNOSIS — Z0000 Encounter for general adult medical examination without abnormal findings: Secondary | ICD-10-CM | POA: Diagnosis not present

## 2018-04-07 ENCOUNTER — Other Ambulatory Visit: Payer: Self-pay | Admitting: Internal Medicine

## 2018-04-07 DIAGNOSIS — E785 Hyperlipidemia, unspecified: Secondary | ICD-10-CM

## 2018-04-15 DIAGNOSIS — Z1212 Encounter for screening for malignant neoplasm of rectum: Secondary | ICD-10-CM | POA: Diagnosis not present

## 2018-04-18 ENCOUNTER — Ambulatory Visit
Admission: RE | Admit: 2018-04-18 | Discharge: 2018-04-18 | Disposition: A | Discharge status: Home / Self Care | Payer: No Typology Code available for payment source | Source: Ambulatory Visit | Attending: Internal Medicine | Admitting: Internal Medicine

## 2018-04-18 DIAGNOSIS — E785 Hyperlipidemia, unspecified: Secondary | ICD-10-CM

## 2018-07-06 DIAGNOSIS — R7989 Other specified abnormal findings of blood chemistry: Secondary | ICD-10-CM | POA: Diagnosis not present

## 2018-07-06 DIAGNOSIS — D696 Thrombocytopenia, unspecified: Secondary | ICD-10-CM | POA: Diagnosis not present

## 2018-07-26 DIAGNOSIS — R221 Localized swelling, mass and lump, neck: Secondary | ICD-10-CM | POA: Diagnosis not present

## 2018-08-26 ENCOUNTER — Other Ambulatory Visit (INDEPENDENT_AMBULATORY_CARE_PROVIDER_SITE_OTHER): Payer: Self-pay | Admitting: Orthopedic Surgery

## 2018-08-26 ENCOUNTER — Telehealth (INDEPENDENT_AMBULATORY_CARE_PROVIDER_SITE_OTHER): Payer: Self-pay

## 2018-08-26 ENCOUNTER — Other Ambulatory Visit (INDEPENDENT_AMBULATORY_CARE_PROVIDER_SITE_OTHER): Payer: Self-pay

## 2018-08-26 MED ORDER — COLCHICINE 0.6 MG PO TABS: ORAL_TABLET | ORAL | 0 refills | Status: AC

## 2018-08-26 MED ORDER — COLCHICINE 0.6 MG PO CAPS: ORAL_CAPSULE | ORAL | 0 refills | Status: DC

## 2018-08-26 NOTE — Telephone Encounter (Signed)
Colchicine 0.6mg  sent to patient pharmacy as requested.

## 2018-08-26 NOTE — Telephone Encounter (Signed)
I talked to Troy Gentry today.  He was having some right foot pain.  Has a history of gout in the right foot 2 years ago.  Had successful result with colchicine at the time.  Uric acid also 8.3 at that time.  His pain started acutely at 2 AM last night.  He did have some red meat and red wine prior to that.  I think this looks like a gout attack.  I will call him in some colchicine to take for this acute flare.  If he does not improve then he should come in next week for clinic appointment.  He did send me a picture which I reviewed of both feet side-by-side together and the right foot does appear to have swelling and redness consistent with gout attack.  He denies any fevers or chills.

## 2018-10-28 DIAGNOSIS — Z20828 Contact with and (suspected) exposure to other viral communicable diseases: Secondary | ICD-10-CM | POA: Diagnosis not present

## 2019-01-24 IMAGING — CT CT HEART SCORING
3 series · 13 of 20 positions shown, 15 images · non-contrast
Comparison: None.

CLINICAL DATA: Hyperlipidemia.

EXAM:
CT HEART FOR CALCIUM SCORING
TECHNIQUE: CT heart was performed on a 64 channel system using prospective ECG
gating.
A non-contrast exam for calcium scoring was performed.
Note that this exam targets the heart and the chest was not imaged
in its entirety.

[Series 2: calcium scoring 2.00 qr36 bestdiast 67% · axial · 0.35mm/px · z∈[+1577,+1629]mm · 3 of 65 slices shown]
[im 13/65  vessel]
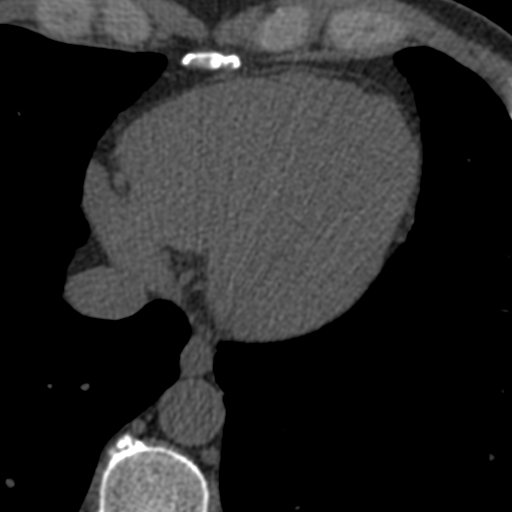
[im 26/65  vessel]
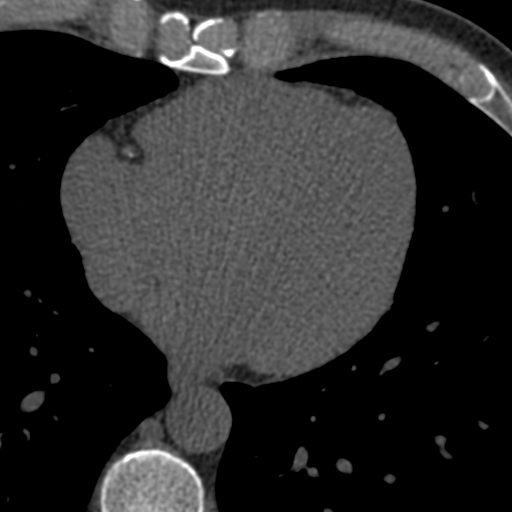
[im 39/65  vessel]
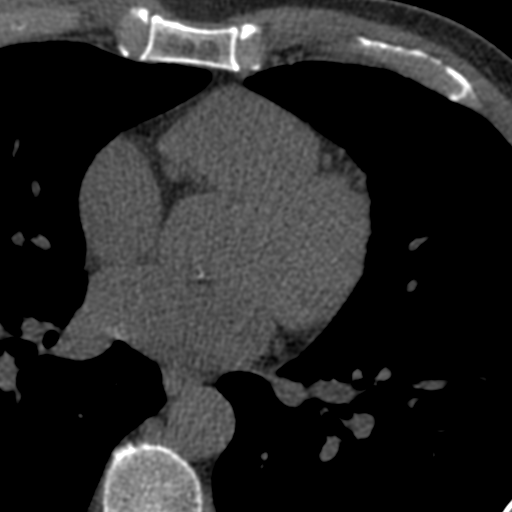

[Series 3: calcium scoring 2.00 br40 bestdiast 67% ax fov · axial · 0.56mm/px · z∈[+1571,+1661]mm · 5 of 69 slices shown, 7 images]
[im 12/69  vessel]
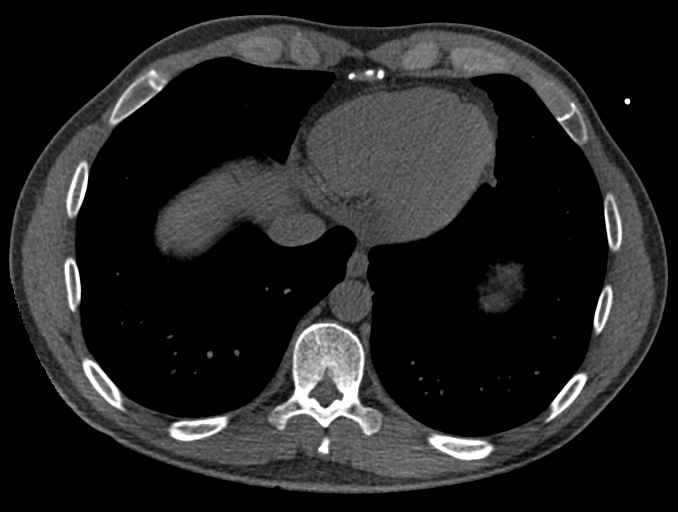
[im 12/69  lung]
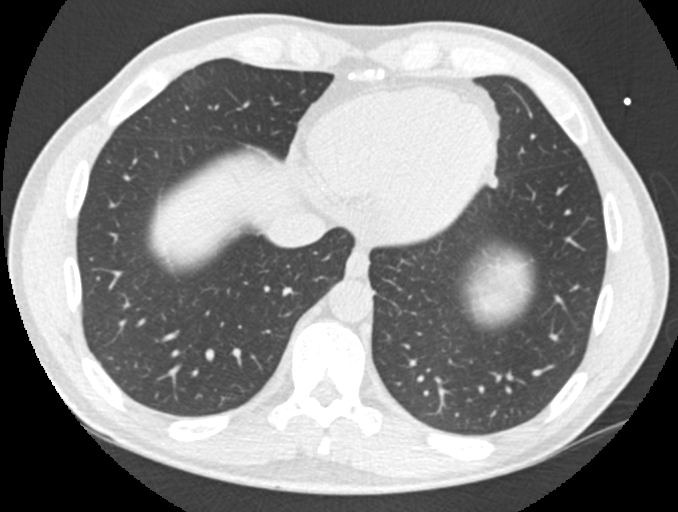
[im 23/69  vessel]
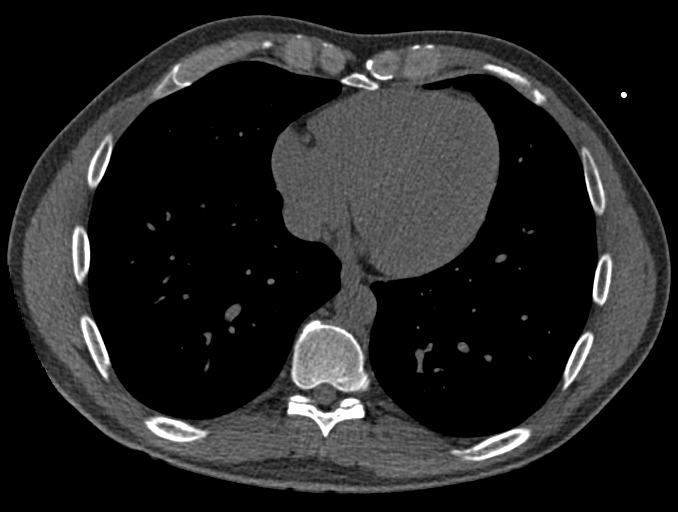
[im 35/69  vessel]
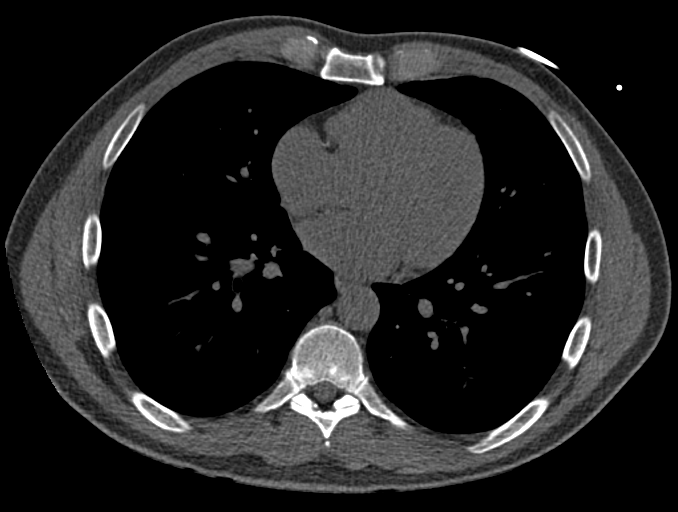
[im 46/69  vessel]
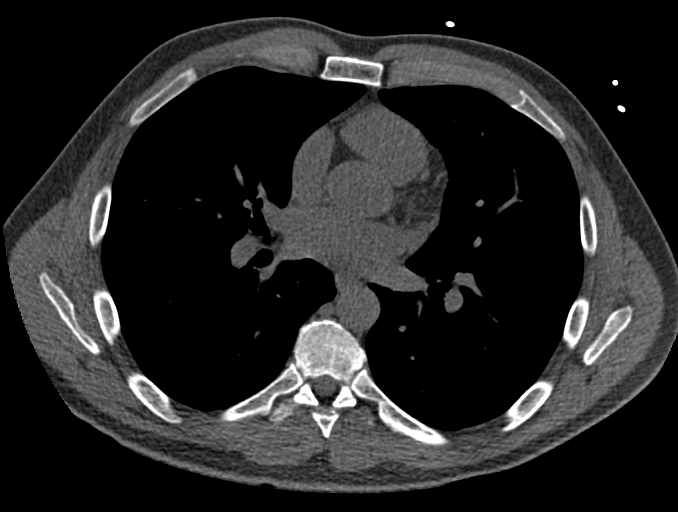
[im 57/69  vessel]
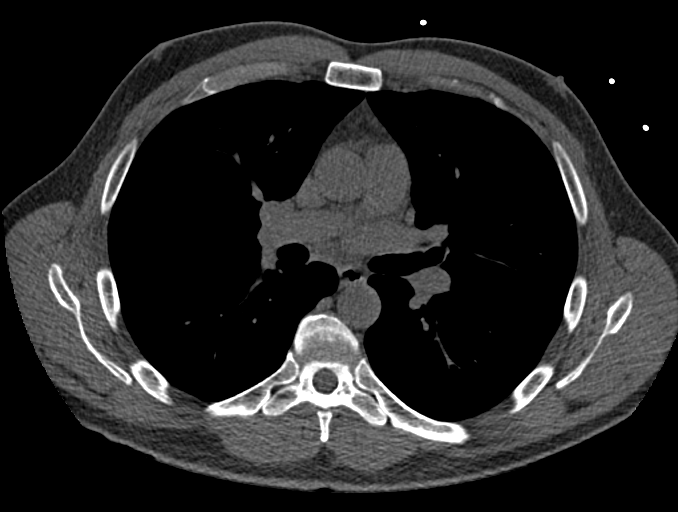
[im 57/69  lung]
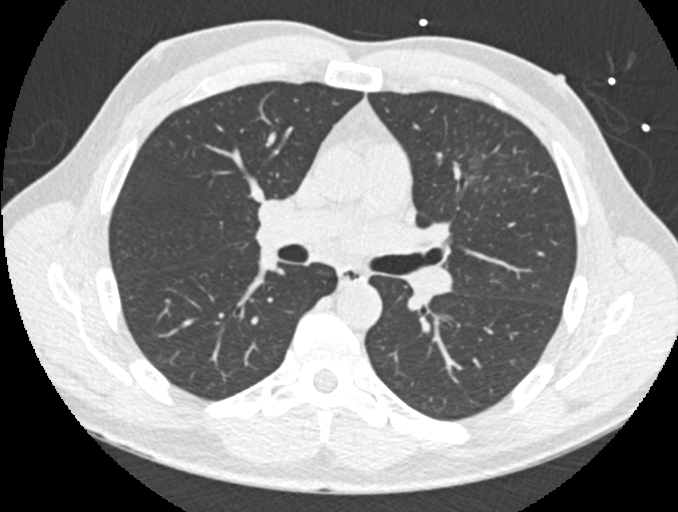

[Series 9: calcium scoring 2.00 br60 bestdiast 67% ax fov · axial · 0.54mm/px · z∈[+1569,+1661]mm · 5 of 70 slices shown]
[im 12/70  vessel]
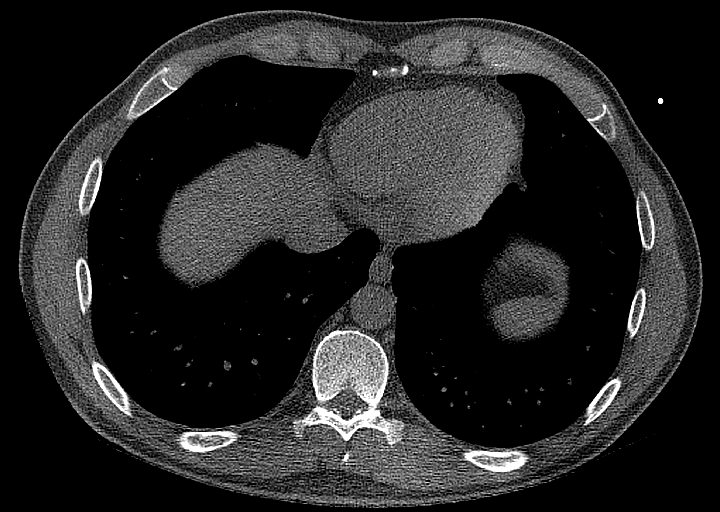
[im 24/70  vessel]
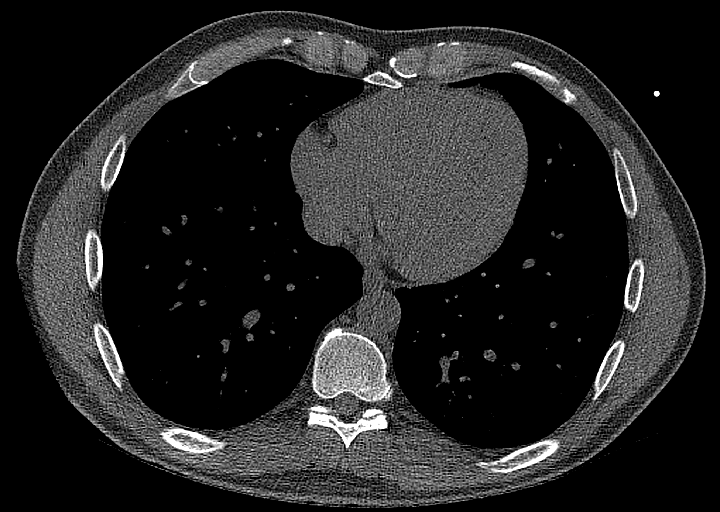
[im 35/70  vessel]
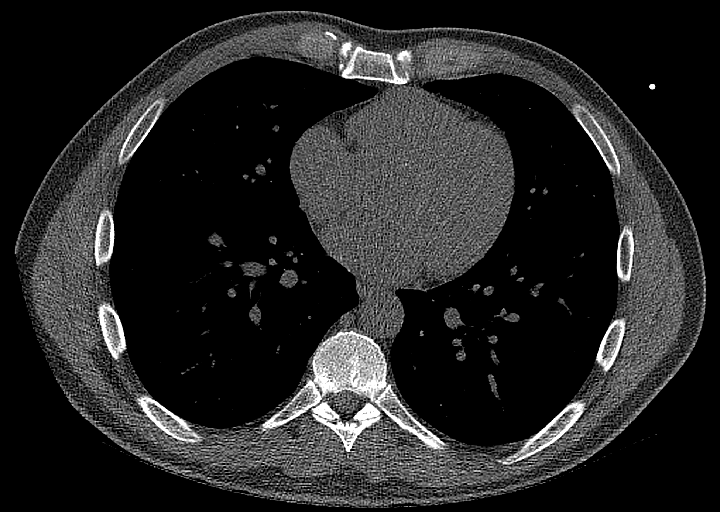
[im 47/70  vessel]
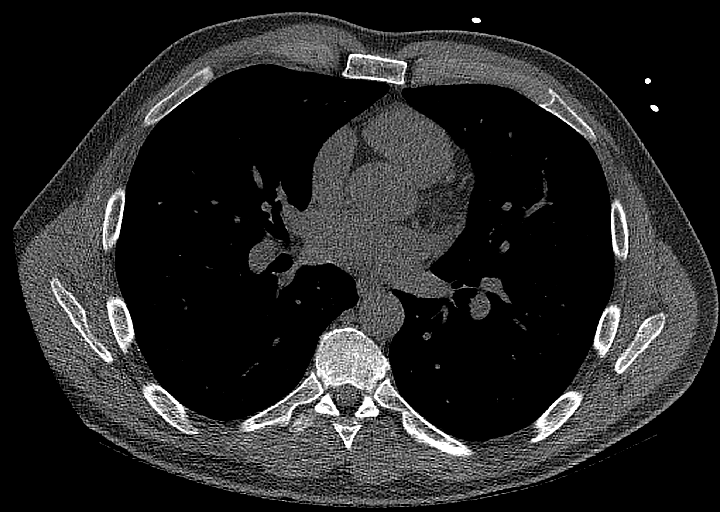
[im 58/70  vessel]
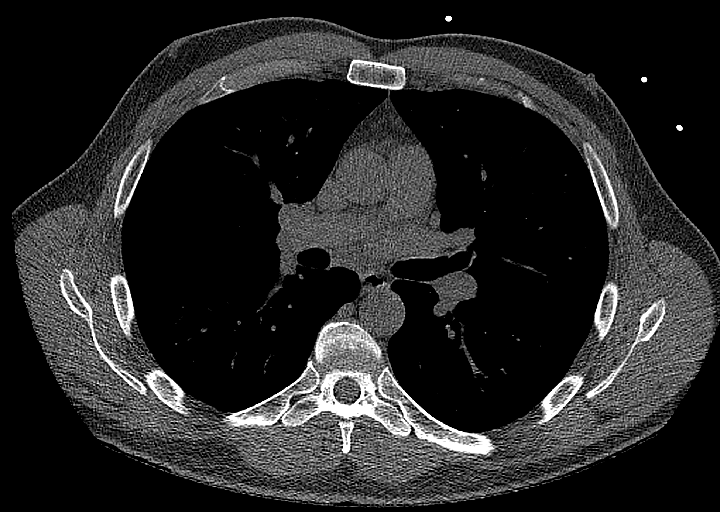

[13 of 20 positions shown; findings below may reference images not displayed]

FINDINGS: Technical quality: Good.

CORONARY CALCIUM

Total Agatston Score: 9.9-coronary calcium is identified within the
mid LAD and mid/distal right coronary arteries-series 2.

[HOSPITAL] percentile:  77th

OTHER FINDINGS:

Cardiovascular: Normal aortic caliber. Normal heart size, without
pericardial effusion.

Mediastinum/Nodes: No imaged thoracic adenopathy.

Lungs/Pleura: No pleural fluid. Minimal motion in the lower chest.
Clear imaged lungs.

Upper Abdomen: Normal imaged portions of the liver, spleen, stomach.

Musculoskeletal: Lower thoracic spondylosis.
IMPRESSION: 1. Total Agatston score of 9.9, corresponding to 77th percentile for
age, sex, and race based cohort.
2. No acute process in the imaged chest.

## 2019-03-08 DIAGNOSIS — J111 Influenza due to unidentified influenza virus with other respiratory manifestations: Secondary | ICD-10-CM | POA: Diagnosis not present

## 2019-05-01 DIAGNOSIS — Z20828 Contact with and (suspected) exposure to other viral communicable diseases: Secondary | ICD-10-CM | POA: Diagnosis not present

## 2019-05-23 DIAGNOSIS — Z1159 Encounter for screening for other viral diseases: Secondary | ICD-10-CM | POA: Diagnosis not present

## 2019-07-14 DIAGNOSIS — Z20828 Contact with and (suspected) exposure to other viral communicable diseases: Secondary | ICD-10-CM | POA: Diagnosis not present

## 2019-07-18 ENCOUNTER — Other Ambulatory Visit: Payer: Self-pay

## 2019-07-18 ENCOUNTER — Ambulatory Visit: Payer: BLUE CROSS/BLUE SHIELD | Attending: Internal Medicine

## 2019-07-18 DIAGNOSIS — Z23 Encounter for immunization: Secondary | ICD-10-CM

## 2019-07-18 DIAGNOSIS — Z20828 Contact with and (suspected) exposure to other viral communicable diseases: Secondary | ICD-10-CM | POA: Diagnosis not present

## 2019-07-18 NOTE — Progress Notes (Signed)
   Covid-19 Vaccination Clinic  Name:  Troy Gentry    MRN: [id number] DOB: 20-Aug-1970  07/18/2019  [name] was observed post Covid-19 immunization for 15 minutes without incidence. He was provided with Vaccine Information Sheet and instruction to access the V-Safe system.   [name] was instructed to call 911 with any severe reactions post vaccine: Marland Kitchen Difficulty breathing  . Swelling of your face and throat  . A fast heartbeat  . A bad rash all over your body  . Dizziness and weakness    Immunizations Administered    Name Date Dose VIS Date Route   Moderna COVID-19 Vaccine 07/18/2019 10:23 AM 0.5 mL 05/02/2019 Intramuscular   Manufacturer: Moderna   Lot: [id number]   PelzerPO:[id number]

## 2019-07-23 DIAGNOSIS — R0981 Nasal congestion: Secondary | ICD-10-CM | POA: Diagnosis not present

## 2019-07-23 DIAGNOSIS — J029 Acute pharyngitis, unspecified: Secondary | ICD-10-CM | POA: Diagnosis not present

## 2019-07-28 DIAGNOSIS — Z20828 Contact with and (suspected) exposure to other viral communicable diseases: Secondary | ICD-10-CM | POA: Diagnosis not present

## 2019-08-10 DIAGNOSIS — Z20828 Contact with and (suspected) exposure to other viral communicable diseases: Secondary | ICD-10-CM | POA: Diagnosis not present

## 2019-08-16 ENCOUNTER — Ambulatory Visit: Payer: Self-pay | Attending: Internal Medicine

## 2019-08-16 DIAGNOSIS — Z23 Encounter for immunization: Secondary | ICD-10-CM

## 2019-08-16 NOTE — Progress Notes (Signed)
   Covid-19 Vaccination Clinic  Name:  Troy Gentry    MRN: [id number] DOB: December 03, 1970  08/16/2019  Troy Gentry was observed post Covid-19 immunization for 15 minutes without incident. He was provided with Vaccine Information Sheet and instruction to access the V-Safe system.   Troy Gentry was instructed to call 911 with any severe reactions post vaccine: Marland Kitchen Difficulty breathing  . Swelling of face and throat  . A fast heartbeat  . A bad rash all over body  . Dizziness and weakness   Immunizations Administered    Name Date Dose VIS Date Route   Moderna COVID-19 Vaccine 08/16/2019  9:52 AM 0.5 mL 05/02/2019 Intramuscular   Manufacturer: Moderna   Lot: [id number]   SimpsonPO:[id number]

## 2019-09-11 DIAGNOSIS — Z0000 Encounter for general adult medical examination without abnormal findings: Secondary | ICD-10-CM | POA: Diagnosis not present

## 2019-09-11 DIAGNOSIS — Z20828 Contact with and (suspected) exposure to other viral communicable diseases: Secondary | ICD-10-CM | POA: Diagnosis not present

## 2019-09-11 DIAGNOSIS — E7849 Other hyperlipidemia: Secondary | ICD-10-CM | POA: Diagnosis not present

## 2019-09-11 DIAGNOSIS — Z125 Encounter for screening for malignant neoplasm of prostate: Secondary | ICD-10-CM | POA: Diagnosis not present

## 2019-09-14 DIAGNOSIS — D696 Thrombocytopenia, unspecified: Secondary | ICD-10-CM | POA: Diagnosis not present

## 2019-09-14 DIAGNOSIS — R785 Finding of other psychotropic drug in blood: Secondary | ICD-10-CM | POA: Diagnosis not present

## 2019-09-14 DIAGNOSIS — R7989 Other specified abnormal findings of blood chemistry: Secondary | ICD-10-CM | POA: Diagnosis not present

## 2019-09-14 DIAGNOSIS — Z0000 Encounter for general adult medical examination without abnormal findings: Secondary | ICD-10-CM | POA: Diagnosis not present

## 2019-09-14 DIAGNOSIS — M109 Gout, unspecified: Secondary | ICD-10-CM | POA: Diagnosis not present

## 2019-09-14 DIAGNOSIS — R82998 Other abnormal findings in urine: Secondary | ICD-10-CM | POA: Diagnosis not present

## 2019-09-20 DIAGNOSIS — Z1212 Encounter for screening for malignant neoplasm of rectum: Secondary | ICD-10-CM | POA: Diagnosis not present

## 2019-09-22 ENCOUNTER — Telehealth: Payer: Self-pay | Admitting: Orthopedic Surgery

## 2019-09-22 NOTE — Telephone Encounter (Signed)
Patient called wanting an appointment with Dr. Marlou Sa.  I advised patient that Dr. Randel Pigg next available was not until May 12th.  Patient has been having knee pain for the past 3 weeks to a month in the knee that was operated on by Dr. Marlou Sa.  He is wanting to know if he could be worked in earlier than May 12th.  CB#[phone].  Thank you.

## 2019-09-22 NOTE — Telephone Encounter (Signed)
Per Dr. Marlou Sa, work in today or Monday afternoon. Patient unable to come this morning. Work in appt made for Monday afternoon.

## 2019-09-25 ENCOUNTER — Ambulatory Visit: Payer: Self-pay

## 2019-09-25 ENCOUNTER — Other Ambulatory Visit: Payer: Self-pay

## 2019-09-25 ENCOUNTER — Ambulatory Visit: Payer: BC Managed Care – PPO | Admitting: Orthopedic Surgery

## 2019-09-25 DIAGNOSIS — M25562 Pain in left knee: Secondary | ICD-10-CM | POA: Diagnosis not present

## 2019-09-27 ENCOUNTER — Encounter: Payer: Self-pay | Admitting: Orthopedic Surgery

## 2019-09-27 NOTE — Progress Notes (Signed)
Office Visit Note   Patient: Troy Gentry           Date of Birth: [birth year]           MRN: [id number] Visit Date: 09/25/2019 Requested by: Shon Baton, [address] [place],  [place] [address] PCP: Shon Baton, MD  Subjective: Chief Complaint  Patient presents with  . Left Knee - Pain    HPI: [name] is a patient with left knee pain.  Injured his knee a month ago and then reinjured it 3 days ago while he was walking [place] the golf [place].  Stepped in a hole and had a hyperextension type injury.  Has had some knee pain and weakness and giving way since that time.  Took meloxicam with some relief.  He does the elliptical and generally has a high level of fitness and strength in his legs.  The hyperextension injury did give him more pain.  He was able to continue playing golf and the swinging portion of it is not necessarily painful but the hyperextension portion of it is.              ROS: All systems reviewed are negative as they relate to the chief complaint within the history of present illness.  Patient denies  fevers or chills.   Assessment & Plan: Visit Diagnoses:  1. Acute pain of left knee     Plan: Impression is left knee pain and effusion with concern for hyperextension injury and increased laxity in the left knee.  He is not really having symptomatic instability per se but is having some pain.  Concern at this time is for attenuation of the ACL graft.  Plan is MRI scan to evaluate meniscal pathology as well as potential ACL reinjury.  In general I do not think that if he has attenuated or stretched his graft he would necessarily need reconstruction based on the quad strength that he has.  We will see what the scan shows and follow-up after that.  Follow-Up Instructions: No follow-ups on file.   Orders:  Orders Placed This Encounter  Procedures  . XR KNEE 3 VIEW LEFT  . MR Knee Left w/o contrast   No orders of the defined types were placed in this encounter.     Procedures: No procedures performed   Clinical Data: No additional findings.  Objective: Vital Signs: There were no vitals taken for this visit.  Physical Exam:   Constitutional: Patient appears well-developed HEENT:  Head: Normocephalic Eyes:EOM are normal Neck: Normal range of motion Cardiovascular: Normal rate Pulmonary/chest: Effort normal Neurologic: Patient is alert Skin: Skin is warm Psychiatric: Patient has normal mood and affect    Ortho Exam: Ortho exam demonstrates mild effusion in the left knee.  Does have increased laxity 3 to 4 mm with relatively decent endpoint.  No push on rotatory instability is noted.  Collaterals are stable to varus valgus stress at 0 30 degrees.  Pedal pulses palpable on the left-hand side.  Specialty Comments:  No specialty comments available.  Imaging: No results found.   PMFS History: Patient Active Problem List   Diagnosis Date Noted  . Routine health maintenance 02/20/2012   Past Medical History:  Diagnosis Date  . Avulsion of hamstring muscle 2012    Family History  Problem Relation Age of Onset  . Arthritis Mother   . Heart disease Father   . Hypertension Father   . Hyperlipidemia Father   . Colitis Sister   .  Cancer Paternal Uncle        colon  . Cancer Paternal Grandmother        colon  . Diabetes Paternal Grandmother   . Cancer Paternal Grandfather        colon  . Colon cancer Paternal Grandfather   . Diabetes Sister   . Heart disease Maternal Grandfather        CAD    Past Surgical History:  Procedure Laterality Date  . EYE SURGERY     Lasik  . KNEE ARTHROSCOPY W/ ACL RECONSTRUCTION     '95, '06 left knee  . SHOULDER ARTHROSCOPY Right 06/17/2015   Procedure: DIAGNOSTIC OPERATIVE RIGHT SHOULDER ARTHROSCOPY, BICEPS RELEASE WITH BICEPS TENODESIS, UPPER SUBSCAPULARIS REPAIR;  Surgeon: Meredith Pel, MD;  Location: [facility];  Service: Orthopedics;  Laterality: Right;   Social History   Occupational  History  . Occupation: Environmental health practitioner  Tobacco Use  . Smoking status: Never Smoker  . Smokeless tobacco: Never Used  Substance and Sexual Activity  . Alcohol use: Yes    Alcohol/week: 7.0 - 8.0 standard drinks    Types: 3 Shots of liquor, 4 - 5 Standard drinks or equivalent per week  . Drug use: No  . Sexual activity: Yes    Partners: Female

## 2019-10-08 ENCOUNTER — Other Ambulatory Visit: Payer: Self-pay

## 2019-10-08 ENCOUNTER — Ambulatory Visit (INDEPENDENT_AMBULATORY_CARE_PROVIDER_SITE_OTHER): Payer: BC Managed Care – PPO

## 2019-10-08 DIAGNOSIS — M25562 Pain in left knee: Secondary | ICD-10-CM

## 2019-10-11 ENCOUNTER — Other Ambulatory Visit: Payer: Self-pay

## 2019-10-11 ENCOUNTER — Ambulatory Visit: Payer: BC Managed Care – PPO | Admitting: Orthopedic Surgery

## 2019-10-11 DIAGNOSIS — S83242D Other tear of medial meniscus, current injury, left knee, subsequent encounter: Secondary | ICD-10-CM | POA: Diagnosis not present

## 2019-10-14 ENCOUNTER — Encounter: Payer: Self-pay | Admitting: Orthopedic Surgery

## 2019-10-14 DIAGNOSIS — S83242D Other tear of medial meniscus, current injury, left knee, subsequent encounter: Secondary | ICD-10-CM

## 2019-10-14 MED ORDER — METHYLPREDNISOLONE ACETATE 40 MG/ML IJ SUSP
40.0000 mg | INTRAMUSCULAR | Status: AC | PRN
  Administered 2019-10-14: 40 mg via INTRA_ARTICULAR

## 2019-10-14 MED ORDER — LIDOCAINE HCL 1 % IJ SOLN
5.0000 mL | INTRAMUSCULAR | Status: AC | PRN
  Administered 2019-10-14: 5 mL

## 2019-10-14 MED ORDER — BUPIVACAINE HCL 0.25 % IJ SOLN
4.0000 mL | INTRAMUSCULAR | Status: AC | PRN
  Administered 2019-10-14: 4 mL via INTRA_ARTICULAR

## 2019-10-14 NOTE — Progress Notes (Signed)
Office Visit Note   Patient: Troy Gentry           Date of Birth: 1970-10-15           MRN: [id number] Visit Date: 10/11/2019 Requested by: Shon Baton, [address] [place],  [place] [address] PCP: Shon Baton, MD  Subjective: Chief Complaint  Patient presents with  . Left Knee - Pain, Follow-up    HPI: Troy Gentry is a patient with left knee pain.  Since have seen him he had an MRI scan.  Was having prodromal symptoms about a month before a hyperextension injury.  MRI scan shows medial meniscal tear as well as chondral defect in that left knee.  Graft looks intact.  He has stopped taking Mobic for 7 days.  Denies much in the way of mechanical symptoms.  Did have some mechanical symptoms early on but they have improved.  Reviewed the MRI scan with Vonna Kotyk.              ROS: All systems reviewed are negative as they relate to the chief complaint within the history of present illness.  Patient denies  fevers or chills.   Assessment & Plan: Visit Diagnoses:  1. Acute medial meniscus tear of left knee, subsequent encounter     Plan: Impression is left knee medial meniscal tear and chondral defect.  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  The meniscal tear is new.  He is having an effusion.  Discussed multiple options today including aspiration and injection with subsequent follow-up in a month to determine whether or not intervention is indicated.  Fortunately the graft is stable on exam and intact on MRI scan..  I think in general he does have a potentially unstable meniscal tear with both horizontal and vertical components.  Nonetheless stable at this time and with large effusion that could be giving him some quad inhibition accounting for some of the symptoms.  I think aspiration and injection is indicated at this time.  We did get about 35 cc of serous fluid out of the knee.  Injection performed.  If he has recurrent mechanical symptoms or effusion I think arthroscopy would be indicated.   He has excellent quad strength and thus I think that could be protective of the knee.  The less of that posterior horn medial meniscus that is present the more potential laxity of the knee could develop but for now we will see in a month through personal communication whether or not any further intervention is indicated.  Follow-Up Instructions: No follow-ups on file.   Orders:  No orders of the defined types were placed in this encounter.  No orders of the defined types were placed in this encounter.     Procedures: Large Joint Inj: L knee on 10/14/2019 7:32 PM Indications: diagnostic evaluation, joint swelling and pain Details: 18 G 1.5 in needle, superolateral approach  Arthrogram: No  Medications: 5 mL lidocaine 1 %; 40 mg methylPREDNISolone acetate 40 MG/ML; 4 mL bupivacaine 0.25 % Outcome: tolerated well, no immediate complications Procedure, treatment alternatives, risks and benefits explained, specific risks discussed. Consent was given by the patient. Immediately prior to procedure a time out was called to verify the correct patient, procedure, equipment, support staff and site/side marked as required. Patient was prepped and draped in the usual sterile fashion.       Clinical Data: No additional findings.  Objective: Vital Signs: There were no vitals taken for this visit.  Physical Exam:  Constitutional: Patient appears well-developed HEENT:  Head: Normocephalic Eyes:EOM are normal Neck: Normal range of motion Cardiovascular: Normal rate Pulmonary/chest: Effort normal Neurologic: Patient is alert Skin: Skin is warm Psychiatric: Patient has normal mood and affect    Ortho Exam: Ortho exam demonstrates mild to moderate effusion in that left knee.  Extensor mechanism is intact.  Collaterals are stable to varus valgus stress at 0 and 30 degrees.  No posterior lateral rotatory instability is noted has about 2 more millimeters of laxity left knee versus right.   Range of motion is full.  Slightly more medial joint line tenderness than lateral.  Specialty Comments:  No specialty comments available.  Imaging: No results found.   PMFS History: Patient Active Problem List   Diagnosis Date Noted  . Routine health maintenance 02/20/2012   Past Medical History:  Diagnosis Date  . Avulsion of hamstring muscle 2012    Family History  Problem Relation Age of Onset  . Arthritis Mother   . Heart disease Father   . Hypertension Father   . Hyperlipidemia Father   . Colitis Sister   . Cancer Paternal Uncle        colon  . Cancer Paternal Grandmother        colon  . Diabetes Paternal Grandmother   . Cancer Paternal Grandfather        colon  . Colon cancer Paternal Grandfather   . Diabetes Sister   . Heart disease Maternal Grandfather        CAD    Past Surgical History:  Procedure Laterality Date  . EYE SURGERY     Lasik  . KNEE ARTHROSCOPY W/ ACL RECONSTRUCTION     '95, '06 left knee  . SHOULDER ARTHROSCOPY Right 06/17/2015   Procedure: DIAGNOSTIC OPERATIVE RIGHT SHOULDER ARTHROSCOPY, BICEPS RELEASE WITH BICEPS TENODESIS, UPPER SUBSCAPULARIS REPAIR;  Surgeon: Meredith Pel, MD;  Location: [facility];  Service: Orthopedics;  Laterality: Right;   Social History   Occupational History  . Occupation: Environmental health practitioner  Tobacco Use  . Smoking status: Never Smoker  . Smokeless tobacco: Never Used  Substance and Sexual Activity  . Alcohol use: Yes    Alcohol/week: 7.0 - 8.0 standard drinks    Types: 3 Shots of liquor, 4 - 5 Standard drinks or equivalent per week  . Drug use: No  . Sexual activity: Yes    Partners: Female

## 2020-01-05 DIAGNOSIS — R05 Cough: Secondary | ICD-10-CM | POA: Diagnosis not present

## 2020-01-05 DIAGNOSIS — Z20828 Contact with and (suspected) exposure to other viral communicable diseases: Secondary | ICD-10-CM | POA: Diagnosis not present

## 2020-01-06 DIAGNOSIS — Z20828 Contact with and (suspected) exposure to other viral communicable diseases: Secondary | ICD-10-CM | POA: Diagnosis not present

## 2020-01-06 DIAGNOSIS — R05 Cough: Secondary | ICD-10-CM | POA: Diagnosis not present

## 2020-01-12 DIAGNOSIS — Z20828 Contact with and (suspected) exposure to other viral communicable diseases: Secondary | ICD-10-CM | POA: Diagnosis not present

## 2020-01-12 DIAGNOSIS — R05 Cough: Secondary | ICD-10-CM | POA: Diagnosis not present

## 2020-01-13 DIAGNOSIS — Z712 Person consulting for explanation of examination or test findings: Secondary | ICD-10-CM | POA: Diagnosis not present

## 2020-01-13 DIAGNOSIS — R05 Cough: Secondary | ICD-10-CM | POA: Diagnosis not present

## 2020-01-13 DIAGNOSIS — Z20828 Contact with and (suspected) exposure to other viral communicable diseases: Secondary | ICD-10-CM | POA: Diagnosis not present

## 2020-01-26 DIAGNOSIS — R05 Cough: Secondary | ICD-10-CM | POA: Diagnosis not present

## 2020-01-26 DIAGNOSIS — Z20828 Contact with and (suspected) exposure to other viral communicable diseases: Secondary | ICD-10-CM | POA: Diagnosis not present

## 2020-01-27 DIAGNOSIS — Z20828 Contact with and (suspected) exposure to other viral communicable diseases: Secondary | ICD-10-CM | POA: Diagnosis not present

## 2020-01-27 DIAGNOSIS — Z712 Person consulting for explanation of examination or test findings: Secondary | ICD-10-CM | POA: Diagnosis not present

## 2020-01-27 DIAGNOSIS — R05 Cough: Secondary | ICD-10-CM | POA: Diagnosis not present

## 2020-02-23 DIAGNOSIS — Z20828 Contact with and (suspected) exposure to other viral communicable diseases: Secondary | ICD-10-CM | POA: Diagnosis not present

## 2020-02-24 DIAGNOSIS — Z20828 Contact with and (suspected) exposure to other viral communicable diseases: Secondary | ICD-10-CM | POA: Diagnosis not present

## 2020-03-15 DIAGNOSIS — Z20828 Contact with and (suspected) exposure to other viral communicable diseases: Secondary | ICD-10-CM | POA: Diagnosis not present

## 2020-03-16 DIAGNOSIS — Z20828 Contact with and (suspected) exposure to other viral communicable diseases: Secondary | ICD-10-CM | POA: Diagnosis not present

## 2020-04-05 ENCOUNTER — Other Ambulatory Visit: Payer: Self-pay

## 2020-04-05 ENCOUNTER — Ambulatory Visit: Payer: BC Managed Care – PPO | Admitting: Orthopedic Surgery

## 2020-04-05 DIAGNOSIS — S83242D Other tear of medial meniscus, current injury, left knee, subsequent encounter: Secondary | ICD-10-CM | POA: Diagnosis not present

## 2020-04-07 ENCOUNTER — Encounter: Payer: Self-pay | Admitting: Orthopedic Surgery

## 2020-04-07 NOTE — Progress Notes (Signed)
Office Visit Note   Patient: Troy Gentry           Date of Birth: 01/28/1971           MRN: [id number] Visit Date: 04/05/2020 Requested by: Shon Baton, [place],  [place] [address] PCP: Shon Baton, MD  Subjective: Chief Complaint  Patient presents with  . 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 He has had an extensive history with revision ACL reconstruction on the left knee years ago. Had a twisting injury which we worked up earlier this year. Medial meniscal tear was demonstrated but he has improved clinically with activity modification and an aspiration injection at that time. He has had some mild recurrent swelling. From an activity standpoint he is very active doing elliptical training in the morning and playing golf. He wants to return to higher risk and stress activities such as wakeboarding and waterskiing in the summer. Has not had many mechanical symptoms in the knee. Does report occasional effusion in the knee. I reviewed his scan and he does have a horizontal cleavage type meniscal tear involving the medial meniscus which has had prior partial resection. He also has a chondral defect on the medial femoral condyle. The chondral defect does not have any surrounding bruising at the time of the MRI scan earlier this year.              ROS: All systems reviewed are negative as they relate to the chief complaint within the history of present illness.  Patient denies  fevers or chills.   Assessment & Plan: Visit Diagnoses:  1. Acute medial meniscus tear of left knee, subsequent encounter     Plan: Impression is right knee pain which is mild plus effusion which seems to be fairly constant based on Troy Gentry's activity level. Understandable that he does not want to proceed with any type of surgical intervention at this time. Clinically his knee is stable and he is not really having much in the way  of mechanical symptoms. Nonetheless he does tend to keep an effusion in his knee indicating some degree of aggravation and irritation in the joint likely from that meniscal tear and possibly the chondral defect. Plan at this time is to aspirate that knee. I think that meniscal treatment could give some degree of improvement in the effusion. Nonetheless I think it is a risk with or without surgery for Troy Gentry to participate in high risk water sport activities. Although he is skilled and experienced in these types of water sports I think that the knee is showing some signs of wear which would likely be partially correctable with arthroscopic intervention. He will follow-up as needed. We did aspirate about 20 cc of clear yellow blood-tinged fluid out of the knee today.  Follow-Up Instructions: Return if symptoms worsen or fail to improve.   Orders:  No orders of the defined types were placed in this encounter.  No orders of the defined types were placed in this encounter.     Procedures: No procedures performed   Clinical Data: No additional findings.  Objective: Vital Signs: There were no vitals taken for this visit.  Physical Exam:   Constitutional: Patient appears well-developed HEENT:  Head: Normocephalic Eyes:EOM are normal Neck: Normal range of motion Cardiovascular: Normal rate Pulmonary/chest: Effort normal Neurologic: Patient is alert Skin: Skin is warm Psychiatric: Patient has normal mood and affect  Ortho Exam: Ortho exam demonstrates full range of motion of that left knee with mild effusion. Collateral crucial ligaments are stable. Not much in way of focal medial joint line or lateral joint line tenderness. Mild patellofemoral crepitus is present. No other masses lymphadenopathy or skin changes noted in that left knee region.  Specialty Comments:  No specialty comments available.  Imaging: No results found.   PMFS History: Patient Active Problem List   Diagnosis  Date Noted  . Routine health maintenance 02/20/2012   Past Medical History:  Diagnosis Date  . Avulsion of hamstring muscle 2012    Family History  Problem Relation Age of Onset  . Arthritis Mother   . Heart disease Father   . Hypertension Father   . Hyperlipidemia Father   . Colitis Sister   . Cancer Paternal Uncle        colon  . Cancer Paternal Grandmother        colon  . Diabetes Paternal Grandmother   . Cancer Paternal Grandfather        colon  . Colon cancer Paternal Grandfather   . Diabetes Sister   . Heart disease Maternal Grandfather        CAD    Past Surgical History:  Procedure Laterality Date  . EYE SURGERY     Lasik  . KNEE ARTHROSCOPY W/ ACL RECONSTRUCTION     '95, '06 left knee  . SHOULDER ARTHROSCOPY Right 06/17/2015   Procedure: DIAGNOSTIC OPERATIVE RIGHT SHOULDER ARTHROSCOPY, BICEPS RELEASE WITH BICEPS TENODESIS, UPPER SUBSCAPULARIS REPAIR;  Surgeon: Meredith Pel, MD;  Location: [facility];  Service: Orthopedics;  Laterality: Right;   Social History   Occupational History  . Occupation: Environmental health practitioner  Tobacco Use  . Smoking status: Never Smoker  . Smokeless tobacco: Never Used  Substance and Sexual Activity  . Alcohol use: Yes    Alcohol/week: 7.0 - 8.0 standard drinks    Types: 3 Shots of liquor, 4 - 5 Standard drinks or equivalent per week  . Drug use: No  . Sexual activity: Yes    Partners: Female

## 2020-05-20 DIAGNOSIS — Z20828 Contact with and (suspected) exposure to other viral communicable diseases: Secondary | ICD-10-CM | POA: Diagnosis not present

## 2020-05-21 DIAGNOSIS — Z20828 Contact with and (suspected) exposure to other viral communicable diseases: Secondary | ICD-10-CM | POA: Diagnosis not present

## 2020-06-03 DIAGNOSIS — Z20828 Contact with and (suspected) exposure to other viral communicable diseases: Secondary | ICD-10-CM | POA: Diagnosis not present

## 2020-06-04 DIAGNOSIS — Z20828 Contact with and (suspected) exposure to other viral communicable diseases: Secondary | ICD-10-CM | POA: Diagnosis not present

## 2020-06-05 DIAGNOSIS — Z20828 Contact with and (suspected) exposure to other viral communicable diseases: Secondary | ICD-10-CM | POA: Diagnosis not present

## 2020-07-05 DIAGNOSIS — Z20828 Contact with and (suspected) exposure to other viral communicable diseases: Secondary | ICD-10-CM | POA: Diagnosis not present

## 2020-07-06 DIAGNOSIS — Z20828 Contact with and (suspected) exposure to other viral communicable diseases: Secondary | ICD-10-CM | POA: Diagnosis not present

## 2020-09-11 DIAGNOSIS — Z125 Encounter for screening for malignant neoplasm of prostate: Secondary | ICD-10-CM | POA: Diagnosis not present

## 2020-09-11 DIAGNOSIS — M109 Gout, unspecified: Secondary | ICD-10-CM | POA: Diagnosis not present

## 2020-09-11 DIAGNOSIS — E785 Hyperlipidemia, unspecified: Secondary | ICD-10-CM | POA: Diagnosis not present

## 2020-09-11 DIAGNOSIS — Z0000 Encounter for general adult medical examination without abnormal findings: Secondary | ICD-10-CM | POA: Diagnosis not present

## 2020-09-17 DIAGNOSIS — Z1212 Encounter for screening for malignant neoplasm of rectum: Secondary | ICD-10-CM | POA: Diagnosis not present

## 2020-09-17 DIAGNOSIS — R82998 Other abnormal findings in urine: Secondary | ICD-10-CM | POA: Diagnosis not present

## 2020-09-30 DIAGNOSIS — E785 Hyperlipidemia, unspecified: Secondary | ICD-10-CM | POA: Diagnosis not present

## 2020-09-30 DIAGNOSIS — Z1339 Encounter for screening examination for other mental health and behavioral disorders: Secondary | ICD-10-CM | POA: Diagnosis not present

## 2020-09-30 DIAGNOSIS — Z0000 Encounter for general adult medical examination without abnormal findings: Secondary | ICD-10-CM | POA: Diagnosis not present

## 2020-09-30 DIAGNOSIS — Z1331 Encounter for screening for depression: Secondary | ICD-10-CM | POA: Diagnosis not present

## 2020-10-15 ENCOUNTER — Encounter: Payer: Self-pay | Admitting: Internal Medicine

## 2020-10-29 ENCOUNTER — Encounter: Payer: Self-pay | Admitting: Internal Medicine

## 2020-12-12 ENCOUNTER — Ambulatory Visit (INDEPENDENT_AMBULATORY_CARE_PROVIDER_SITE_OTHER): Payer: BC Managed Care – PPO | Admitting: Neurology

## 2020-12-12 ENCOUNTER — Encounter: Payer: Self-pay | Admitting: Neurology

## 2020-12-12 ENCOUNTER — Other Ambulatory Visit: Payer: Self-pay

## 2020-12-12 VITALS — BP 135/77 | HR 64 | Ht 71.0 in | Wt 187.0 lb

## 2020-12-12 DIAGNOSIS — R0683 Snoring: Secondary | ICD-10-CM | POA: Diagnosis not present

## 2020-12-12 DIAGNOSIS — R0681 Apnea, not elsewhere classified: Secondary | ICD-10-CM

## 2020-12-12 DIAGNOSIS — R5383 Other fatigue: Secondary | ICD-10-CM

## 2020-12-12 DIAGNOSIS — Z820 Family history of epilepsy and other diseases of the nervous system: Secondary | ICD-10-CM

## 2020-12-12 DIAGNOSIS — E663 Overweight: Secondary | ICD-10-CM

## 2020-12-12 DIAGNOSIS — G478 Other sleep disorders: Secondary | ICD-10-CM

## 2020-12-12 NOTE — Patient Instructions (Signed)

## 2020-12-12 NOTE — Progress Notes (Signed)
Subjective:    Patient ID: Troy Gentry is a 50 y.o. male.  HPI    Star Age, MD, PhD [facility] [address] [address] [place], [place] [address]  Dear Dr. Virgina Jock,   I saw your patient, Troy Gentry, upon your kind request in my sleep clinic today for initial consultation of his sleep disorder, in particular, concern for underlying obstructive sleep apnea.  The patient is unaccompanied today.  As you know, [name] is a 50 year old right-handed gentleman with an underlying medical history of osteoarthritis, gout, hyperlipidemia, status post shoulder surgery, status post knee surgery, history of hamstring injury, and mildly overweight state, who reports snoring and excessive daytime somnolence, as well as witnessed apneas per wife's report.  He does not always wake up fully rested.  He does have a tendency to doze off in the evenings when he is sedentary.   He manages a [employer].  He lives at home with his family which includes wife and children, he has a 65 year old, and 87 year old who is getting ready to go to college and he has a 56-1/2-year-old.  They have 1 dog in the household.  He does have a TV in the bedroom but he does not typically watch TV in the bedroom.  He reports a family history of sleep apnea affecting his father who has a CPAP machine.  He had a sister who passed away and she also had sleep apnea. I reviewed your office note from 09/30/2020.  His Epworth sleepiness score is 6 out of 24, fatigue severity score is 26 out of 63.  Bedtime is generally around 11 and rise time at 5:15 AM.  He exercises in the mornings, typically 5 times a week.  His weight has been more or less stable.  He likes to drink caffeine, typically coffee in the morning and 3 to 4 cans of soda per day.  He drinks alcohol about 3 days out of the week.  He is a non-smoker.  He does not have recurrent morning headaches and denies night to night nocturia. He had  blood work through your office on 09/11/2020 and I was able to review the results, CBC showed slightly low WBC at 3.81, borderline platelets at 141, lipid panel showed a total cholesterol elevated at 241, triglycerides 64, HDL 53, LDL 175.  Uric acid was elevated at 8.5, TSH normal at 1.72, PSA normal at 0.479.  His Past Medical History Is Significant For: Past Medical History:  Diagnosis Date   Avulsion of hamstring muscle 2012    His Past Surgical History Is Significant For: Past Surgical History:  Procedure Laterality Date   EYE SURGERY     Lasik   KNEE ARTHROSCOPY Left    x 2   KNEE ARTHROSCOPY W/ ACL RECONSTRUCTION     '95, '06 left knee   SHOULDER ARTHROSCOPY Right 06/17/2015   Procedure: DIAGNOSTIC OPERATIVE RIGHT SHOULDER ARTHROSCOPY, BICEPS RELEASE WITH BICEPS TENODESIS, UPPER SUBSCAPULARIS REPAIR;  Surgeon: Meredith Pel, MD;  Location: [facility];  Service: Orthopedics;  Laterality: Right;    His Family History Is Significant For: Family History  Problem Relation Age of Onset   Arthritis Mother    Heart disease Father    Hypertension Father    Hyperlipidemia Father    Sleep apnea Father    Colitis Sister    Diabetes Sister    Sleep apnea Sister    Heart disease Maternal Grandfather  CAD   Cancer Paternal Grandmother        colon   Diabetes Paternal Grandmother    Cancer Paternal Grandfather        colon   Colon cancer Paternal Grandfather    Cancer Paternal Uncle        colon    His Social History Is Significant For: Social History   Socioeconomic History   Marital status: Married    Spouse name: Not on file   Number of children: 3   Years of education: 18   Highest education level: Master's degree (e.g., MA, MS, MEng, MEd, MSW, MBA)  Occupational History   Occupation: Environmental health practitioner  Tobacco Use   Smoking status: Never   Smokeless tobacco: Never  Substance and Sexual Activity   Alcohol use: Not Currently    Alcohol/week: 3.0 standard  drinks    Types: 3 Standard drinks or equivalent per week   Drug use: No   Sexual activity: Yes    Partners: Female  Other Topics Concern   Not on file  Social History Narrative   [employer] BA, [place], Married '98, 1 dtr '01, 1 son '03. Work - Passenger transport manager for [employer]. Marriage is in good health.       Lives at home with family    Caffeine: 4 cups/day   Social Determinants of Health   Financial Resource Strain: Not on file  Food Insecurity: Not on file  Transportation Needs: Not on file  Physical Activity: Not on file  Stress: Not on file  Social Connections: Not on file    His Allergies Are:  No Known Allergies:   His Current Medications Are:  Outpatient Encounter Medications as of 12/12/2020  Medication Sig   CIALIS 20 MG tablet TAKE 1/2-1 TABLET BY MOUTH AS NEEDED FOR INTERCOURSE   colchicine 0.6 MG tablet 1 po bid x 3 days then q d prn for persistent symptoms   [DISCONTINUED] Colchicine (MITIGARE) 0.6 MG CAPS 1 po bid x 3 days then q d prn for persistent symptoms   No facility-administered encounter medications on file as of 12/12/2020.  :   Review of Systems:  Out of a complete 14 point review of systems, all are reviewed and negative with the exception of these symptoms as listed below:  Review of Systems  Neurological:        Patient is here alone for sleep evaluation. He has been told by his wife that he snores and he stops breathing in his sleep. He denies any trouble with morning headaches or memory loss.   Epworth Sleepiness Scale 0= would never doze 1= slight chance of dozing 2= moderate chance of dozing 3= high chance of dozing  Sitting and reading: 1 Watching TV: 3 Sitting inactive in a public place (ex. Theater or meeting): 1 As a passenger in a car for an hour without a break: 1 Lying down to rest in the afternoon: 0 Sitting and talking to someone: 0 Sitting quietly after lunch (no alcohol): 0 In a car, while stopped in traffic:0 Total:  6  FSS 26   Objective:  Neurological Exam  Physical Exam Physical Examination:   Vitals:   12/12/20 1324  BP: 135/77  Pulse: 64    General Examination: The patient is a very pleasant 50 y.o. male in no acute distress. He appears well-developed and well-nourished and well groomed.   HEENT: Normocephalic, atraumatic, pupils are equal, round and reactive to light, extraocular tracking is good without limitation  to gaze excursion or nystagmus noted. Hearing is grossly intact. Face is symmetric with normal facial animation. Speech is clear with no dysarthria noted. There is no hypophonia. There is no lip, neck/head, jaw or voice tremor. Neck is supple with full range of passive and active motion. There are no carotid bruits on auscultation. Oropharynx exam reveals: mild mouth dryness, good dental hygiene and mild airway crowding, due to somewhat small airway entry, tonsillar size of about 1+, Mallampati class III, elongated uvula noted.  Tongue protrudes centrally and palate elevates symmetrically, neck circumference of 16-7/8 inches.  He has a minimal overbite.  Nasal inspection reveals no obvious mucosal swelling, or deviated septum, or inferior turbinate hypertrophy.  Chest: Clear to auscultation without wheezing, rhonchi or crackles noted.  Heart: S1+S2+0, regular and normal without murmurs, rubs or gallops noted.   Abdomen: Soft, non-tender and non-distended.  Extremities: There is no pitting edema in the distal lower extremities bilaterally.   Skin: Warm and dry without trophic changes noted.   Musculoskeletal: exam reveals no obvious joint deformities.   Neurologically:  Mental status: The patient is awake, alert and oriented in all 4 spheres. His immediate and remote memory, attention, language skills and fund of knowledge are appropriate. There is no evidence of aphasia, agnosia, apraxia or anomia. Speech is clear with normal prosody and enunciation. Thought process is linear.  Mood is normal and affect is normal.  Cranial nerves II - XII are as described above under HEENT exam.  Motor exam: Normal bulk, strength and tone is noted. There is no tremor, Romberg is negative. Fine motor skills and coordination: grossly intact.  Cerebellar testing: No dysmetria or intention tremor. There is no truncal or gait ataxia.  Sensory exam: intact to light touch in the upper and lower extremities.  Gait, station and balance: He stands easily. No veering to one side is noted. No leaning to one side is noted. Posture is age-appropriate and stance is narrow based. Gait shows normal stride length and normal pace. No problems turning are noted. Tandem walk is unremarkable.                Assessment and Plan:  In summary, CAETANO OBERHAUS is a very pleasant 50 y.o.-year old male with an underlying medical history of osteoarthritis, gout, hyperlipidemia, status post shoulder surgery, status post knee surgery, history of hamstring injury, and mildly overweight state, whose history and physical exam are concerning for obstructive sleep apnea (OSA). I had a long chat with the patient about my findings and the diagnosis of OSA, its prognosis and treatment options. We talked about medical treatments, surgical interventions and non-pharmacological approaches. I explained in particular the risks and ramifications of untreated moderate to severe OSA, especially with respect to developing cardiovascular disease down the Road, including congestive heart failure, difficult to treat hypertension, cardiac arrhythmias, or stroke. Even type 2 diabetes has, in part, been linked to untreated OSA. Symptoms of untreated OSA include daytime sleepiness, memory problems, mood irritability and mood disorder such as depression and anxiety, lack of energy, as well as recurrent headaches, especially morning headaches. We talked about trying to maintain a healthy lifestyle in general, as well as the importance of weight control.  We also talked about the importance of good sleep hygiene. I recommended the following at this time: sleep study.   I explained the sleep test procedure to the patient and also outlined the difference between a laboratory attended sleep study versus home sleep test.  We talked  about possible surgical and non-surgical treatment options of OSA, including the use of a custom-made dental device (which would require a referral to a specialist dentist or oral surgeon), upper airway surgical options, such as traditional UPPP or a novel less invasive surgical option in the form of Inspire hypoglossal nerve stimulation (which would involve a referral to an ENT surgeon). I also explained the CPAP treatment option to the patient, who indicated that he would be willing to try CPAP if the need arises. I answered all his questions today and the patient was in agreement. I plan to see him back after the sleep study is completed and encouraged him to call with any interim questions, concerns, problems or updates.   Thank you very much for allowing me to participate in the care of this nice patient. If I can be of any further assistance to you please do not hesitate to call me at [phone].  Sincerely,   Star Age, MD, PhD

## 2021-01-01 ENCOUNTER — Ambulatory Visit (INDEPENDENT_AMBULATORY_CARE_PROVIDER_SITE_OTHER): Payer: BC Managed Care – PPO | Admitting: Neurology

## 2021-01-01 DIAGNOSIS — G4733 Obstructive sleep apnea (adult) (pediatric): Secondary | ICD-10-CM | POA: Diagnosis not present

## 2021-01-01 DIAGNOSIS — R5383 Other fatigue: Secondary | ICD-10-CM

## 2021-01-01 DIAGNOSIS — R0681 Apnea, not elsewhere classified: Secondary | ICD-10-CM

## 2021-01-01 DIAGNOSIS — E663 Overweight: Secondary | ICD-10-CM

## 2021-01-01 DIAGNOSIS — R0683 Snoring: Secondary | ICD-10-CM

## 2021-01-01 DIAGNOSIS — Z820 Family history of epilepsy and other diseases of the nervous system: Secondary | ICD-10-CM

## 2021-01-01 DIAGNOSIS — G478 Other sleep disorders: Secondary | ICD-10-CM

## 2021-01-02 NOTE — Progress Notes (Signed)
See procedure note.

## 2021-01-02 NOTE — Procedures (Signed)
     [facility]  HOME SLEEP TEST (Watch PAT) REPORT  STUDY DATE: 01/01/2021  DOB: [birth year]  MRN: [id number]  ORDERING CLINICIAN: Star Age, MD, PhD   REFERRING CLINICIAN: Shon Baton, MD   CLINICAL INFORMATION/HISTORY: 50 year old right-handed gentleman with an underlying medical history of osteoarthritis, gout, hyperlipidemia, status post shoulder surgery, status post knee surgery, history of hamstring injury, and mildly overweight state, who reports snoring and excessive daytime somnolence, as well as witnessed apneas per wife's report.  Epworth sleepiness score: 6/24.  BMI: 26.2 kg/m  FINDINGS:   Sleep Summary:   Total Recording Time (hours, min): 7 hours, 28 minutes  Total Sleep Time (hours, min):  6 hours, 42 minutes   Percent REM (%):    17%   Respiratory Indices:   Calculated pAHI (per hour):  5/hour         REM pAHI:    5.3/hour       NREM pAHI: 4.9/hour  Oxygen Saturation Statistics:    Oxygen Saturation (%) Mean: 95%   Minimum oxygen saturation (%):                 89%   O2 Saturation Range (%): 89-99%    O2 Saturation (minutes) <=88%: 0 min  Pulse Rate Statistics:   Pulse Mean (bpm):    61/min    Pulse Range (46-103/min)   IMPRESSION: OSA (obstructive sleep apnea), mild  RECOMMENDATION:  This HST shows borderline obstructive sleep apnea, with an AHI of 5/hour and O2 nadir of 89%.  Mild intermittent snoring was detected. Treatment with positive airway pressure can be considered with autoPAP, if desired by patient. Treatment options otherwise include weight loss and avoidance of the supine sleep position or a dental device. These different avenues will be discussed with the patient. The patient will be seen in follow up in sleep clinic, if necessary. Please note, that other causes of the patient's symptoms, including circadian rhythm disturbances, an underlying mood disorder, medication effect and/or an underlying medical  problem cannot be ruled out based on this test. Clinical correlation is recommended. The patient should be cautioned not to drive, work at heights, or operate dangerous or heavy equipment when tired or sleepy. Review and reiteration of good sleep hygiene measures should be pursued with any patient. The referring provider will be notified of the test results.   I certify that I have reviewed the raw data recording prior to the issuance of this report in accordance with the standards of the American Academy of Sleep Medicine (AASM).  Star Age, MD, PhD  INTERPRETING PHYSICIAN:   Star Age, MD, PhD  Board Certified in Neurology and Sleep Medicine  [facility] [address], [place], [place] [address] [phone]

## 2021-01-08 ENCOUNTER — Telehealth: Payer: Self-pay | Admitting: *Deleted

## 2021-01-08 NOTE — Telephone Encounter (Signed)
Called pt and LVM (ok per DPR) advising patient of home sleep test results as noted in detail below by Dr Rexene Alberts. I present patient with the options for treatment also noted below and I have provided office number and asked the patient to call us back and let us know how he would like to proceed.   Also sent mychart message.

## 2021-01-08 NOTE — Telephone Encounter (Signed)
-----   Message from Star Age, MD sent at 01/02/2021  4:50 PM EDT ----- Patient referred by Dr. Virgina Jock, seen by me on 12/12/2020 for sleep apnea evaluation and he had a home sleep test on 01/01/2021.   Please call and notify the patient that the recent home sleep test did not show any significant degree of sleep apnea.  He had borderline findings with a total AHI of 5/h, O2 nadir of 89%.  Mild intermittent snoring was noted.  Treatment with a machine such as CPAP or AutoPap is not warranted but we can try to consider this if he would like to try an AutoPap machine.  I will say that sometimes the insurance does not cover treatment with a machine such as CPAP or AutoPap if findings are mild or borderline and sleepiness score is less than 10, his sleepiness score was 6 out of 24.  We can certainly submit for authorization if he would like to go ahead and get treated for his mild sleep apnea/borderline sleep apnea with an AutoPap machine.  Alternative treatment options include working on some degree of weight loss although he is not obese.  A dental device can be considered, may not be covered by the insurance but we can certainly consider a referral to a dentist if he would prefer this as his treatment route.  Please let me know how he would like to proceed.    Star Age, MD, PhD [facility])

## 2021-01-09 NOTE — Telephone Encounter (Signed)
Thank you for the update.  I agree that a dental device or a positive airway pressure device such as AutoPap are not necessary.  Sometimes losing a little bit of weight with maintaining a BMI of 25 or less and avoiding to sleep on the back can improve sleep apnea, especially when mild.  At this juncture, I recommend that he follow-up with his primary care physician routinely and we can see him back as needed.

## 2021-01-21 ENCOUNTER — Ambulatory Visit (AMBULATORY_SURGERY_CENTER): Payer: Self-pay

## 2021-01-21 ENCOUNTER — Other Ambulatory Visit: Payer: Self-pay

## 2021-01-21 VITALS — Ht 71.0 in | Wt 187.0 lb

## 2021-01-21 DIAGNOSIS — Z8371 Family history of colonic polyps: Secondary | ICD-10-CM

## 2021-01-21 DIAGNOSIS — Z800 Family history of malignant neoplasm of digestive organs: Secondary | ICD-10-CM

## 2021-01-21 DIAGNOSIS — Z86010 Personal history of colonic polyps: Secondary | ICD-10-CM

## 2021-01-21 MED ORDER — PEG-KCL-NACL-NASULF-NA ASC-C 100 G PO SOLR: 1.0000 | Freq: Once | ORAL | 0 refills | Status: AC

## 2021-01-21 NOTE — Progress Notes (Signed)
Patient is here in-person for PV. Patient denies any allergies to eggs or soy. Patient denies any problems with anesthesia/sedation. Patient denies any oxygen use at home. Patient denies taking any diet/weight loss medications or blood thinners. Patient is aware of our care-partner policy and 0000000 safety protocol.   EMMI education assigned to the patient for the procedure, sent to [facility].   Patient is COVID-19 vaccinated.  [facility] Prescription coupon was given to the patient.

## 2021-02-04 ENCOUNTER — Encounter: Payer: Self-pay | Admitting: Internal Medicine

## 2021-02-04 ENCOUNTER — Ambulatory Visit (AMBULATORY_SURGERY_CENTER): Payer: BC Managed Care – PPO | Admitting: Internal Medicine

## 2021-02-04 ENCOUNTER — Other Ambulatory Visit: Payer: Self-pay

## 2021-02-04 VITALS — BP 102/75 | HR 55 | Temp 97.3°F | Resp 23 | Ht 71.0 in | Wt 187.0 lb

## 2021-02-04 DIAGNOSIS — Z1211 Encounter for screening for malignant neoplasm of colon: Secondary | ICD-10-CM | POA: Diagnosis not present

## 2021-02-04 DIAGNOSIS — D122 Benign neoplasm of ascending colon: Secondary | ICD-10-CM

## 2021-02-04 DIAGNOSIS — Z86010 Personal history of colonic polyps: Secondary | ICD-10-CM | POA: Diagnosis not present

## 2021-02-04 MED ORDER — SODIUM CHLORIDE 0.9 % IV SOLN: 500.0000 mL | INTRAVENOUS | Status: DC

## 2021-02-04 NOTE — Op Note (Signed)
[facility] Patient Name: Troy Gentry Procedure Date: 02/04/2021 10:52 AM MRN: [id number] Endoscopist: Docia Chuck. Henrene Pastor , MD Age: 50 Referring MD:  Date of Birth: Feb 03, 1971 Gender: Male Account #: [id number] Procedure:                Colonoscopy with cold snare polypectomy x 1. Indications:              High risk colon cancer surveillance: Personal                            history of sessile serrated colon polyp (less than                            10 mm in size) with no dysplasia. Index examination                            May 2017. Also, several second-degree relatives                            with history of colorectal cancer. Medicines:                Monitored Anesthesia Care Procedure:                Pre-Anesthesia Assessment:                           - Prior to the procedure, a History and Physical                            was performed, and patient medications and                            allergies were reviewed. The patient's tolerance of                            previous anesthesia was also reviewed. The risks                            and benefits of the procedure and the sedation                            options and risks were discussed with the patient.                            All questions were answered, and informed consent                            was obtained. Prior Anticoagulants: The patient has                            taken no previous anticoagulant or antiplatelet                            agents. ASA Grade Assessment: I - A normal, healthy  patient. After reviewing the risks and benefits,                            the patient was deemed in satisfactory condition to                            undergo the procedure.                           After obtaining informed consent, the colonoscope                            was passed under direct vision. Throughout the                            procedure, the  patient's blood pressure, pulse, and                            oxygen saturations were monitored continuously. The                            CF [id number] was introduced through the anus                            and advanced to the the cecum, identified by                            appendiceal orifice and ileocecal valve. The                            ileocecal valve, appendiceal orifice, and rectum                            were photographed. The quality of the bowel                            preparation was excellent. The colonoscopy was                            performed without difficulty. The patient tolerated                            the procedure well. The bowel preparation used was                            SUPREP via split dose instruction. Scope In: [id number] Scope Out: 11:18:19 AM Scope Withdrawal Time: 0 hours 10 minutes 23 seconds  Total Procedure Duration: 0 hours 12 minutes 10 seconds  Findings:                 A 2 mm polyp was found in the ascending colon. The                            polyp was removed with a cold snare. Resection  and                            retrieval were complete.                           The exam was otherwise without abnormality on                            direct and retroflexion views. Complications:            No immediate complications. Estimated blood loss:                            None. Estimated Blood Loss:     Estimated blood loss: none. Impression:               - One 2 mm polyp in the ascending colon, removed                            with a cold snare. Resected and retrieved.                           - The examination was otherwise normal on direct                            and retroflexion views. Recommendation:           - Repeat colonoscopy in 5 years for surveillance.                           - Patient has a contact number available for                            emergencies. The signs and symptoms of  potential                            delayed complications were discussed with the                            patient. Return to normal activities tomorrow.                            Written discharge instructions were provided to the                            patient.                           - Resume previous diet.                           - Continue present medications.                           - Await pathology results. Docia Chuck. Henrene Pastor, MD 02/04/2021 11:24:48 AM This report has been signed electronically.

## 2021-02-04 NOTE — Progress Notes (Signed)
Vs by cw; previsit completed

## 2021-02-04 NOTE — Patient Instructions (Signed)
Discharge instructions given. Handout on polyps. Resume previous medications. YOU HAD AN ENDOSCOPIC PROCEDURE TODAY AT THE  ENDOSCOPY CENTER:   Refer to the procedure report that was given to you for any specific questions about what was found during the examination.  If the procedure report does not answer your questions, please call your gastroenterologist to clarify.  If you requested that your care partner not be given the details of your procedure findings, then the procedure report has been included in a sealed envelope for you to review at your convenience later.  YOU SHOULD EXPECT: Some feelings of bloating in the abdomen. Passage of more gas than usual.  Walking can help get rid of the air that was put into your GI tract during the procedure and reduce the bloating. If you had a lower endoscopy (such as a colonoscopy or flexible sigmoidoscopy) you may notice spotting of blood in your stool or on the toilet paper. If you underwent a bowel prep for your procedure, you may not have a normal bowel movement for a few days.  Please Note:  You might notice some irritation and congestion in your nose or some drainage.  This is from the oxygen used during your procedure.  There is no need for concern and it should clear up in a day or so.  SYMPTOMS TO REPORT IMMEDIATELY:  Following lower endoscopy (colonoscopy or flexible sigmoidoscopy):  Excessive amounts of blood in the stool  Significant tenderness or worsening of abdominal pains  Swelling of the abdomen that is new, acute  Fever of 100F or higher   For urgent or emergent issues, a gastroenterologist can be reached at any hour by calling [phone]. Do not use MyChart messaging for urgent concerns.    DIET:  We do recommend a small meal at first, but then you may proceed to your regular diet.  Drink plenty of fluids but you should avoid alcoholic beverages for 24 hours.  ACTIVITY:  You should plan to take it easy for the rest  of today and you should NOT DRIVE or use heavy machinery until tomorrow (because of the sedation medicines used during the test).    FOLLOW UP: Our staff will call the number listed on your records 48-72 hours following your procedure to check on you and address any questions or concerns that you may have regarding the information given to you following your procedure. If we do not reach you, we will leave a message.  We will attempt to reach you two times.  During this call, we will ask if you have developed any symptoms of COVID 19. If you develop any symptoms (ie: fever, flu-like symptoms, shortness of breath, cough etc.) before then, please call [phone].  If you test positive for Covid 19 in the 2 weeks post procedure, please call and report this information to us.    If any biopsies were taken you will be contacted by phone or by letter within the next 1-3 weeks.  Please call us at [phone] if you have not heard about the biopsies in 3 weeks.    SIGNATURES/CONFIDENTIALITY: You and/or your care partner have signed paperwork which will be entered into your electronic medical record.  These signatures attest to the fact that that the information above on your After Visit Summary has been reviewed and is understood.  Full responsibility of the confidentiality of this discharge information lies with you and/or your care-partner.  

## 2021-02-04 NOTE — Progress Notes (Signed)
Sedate, gd SR, tolerated procedure well, VSS, report to RN 

## 2021-02-04 NOTE — Progress Notes (Signed)
HISTORY OF PRESENT ILLNESS:  Troy Gentry is a 50 y.o. male with a family history of colon cancer and a personal history of sessile serrated polyp on index colonoscopy May 2017.  Troy Gentry presents today for routine surveillance colonoscopy.  No active GI or non-GI issues.  REVIEW OF SYSTEMS:  All non-GI ROS negative. Past Medical History:  Diagnosis Date   Avulsion of hamstring muscle 2012    Past Surgical History:  Procedure Laterality Date   COLONOSCOPY     EYE SURGERY     Lasik   KNEE ARTHROSCOPY Left    x 2   KNEE ARTHROSCOPY W/ ACL RECONSTRUCTION     '95, '06 left knee   SHOULDER ARTHROSCOPY Right 06/17/2015   Procedure: DIAGNOSTIC OPERATIVE RIGHT SHOULDER ARTHROSCOPY, BICEPS RELEASE WITH BICEPS TENODESIS, UPPER SUBSCAPULARIS REPAIR;  Surgeon: Meredith Pel, MD;  Location: [facility];  Service: Orthopedics;  Laterality: Right;    Social History Troy Gentry  reports that Troy Gentry has never smoked. Troy Gentry has never used smokeless tobacco. Troy Gentry reports that Troy Gentry does not currently use alcohol after a past usage of about 3.0 standard drinks per week. Troy Gentry reports that Troy Gentry does not use drugs.  family history includes Arthritis in his mother; Cancer in his paternal grandfather, paternal grandmother, and paternal uncle; Colitis in his sister; Colon cancer in his paternal grandfather; Diabetes in his paternal grandmother and sister; Heart disease in his father and maternal grandfather; Hyperlipidemia in his father; Hypertension in his father; Sleep apnea in his father and sister.  No Known Allergies     PHYSICAL EXAMINATION:  Vital signs: BP 108/64   Pulse 66   Temp (!) 97.3 F (36.3 C)   Ht '5\' 11"'$  (1.803 m)   Wt 187 lb (84.8 kg)   SpO2 99%   BMI 26.08 kg/m  General: Well-developed, well-nourished, no acute distress HEENT: Sclerae are anicteric, conjunctiva pink. Oral mucosa intact Lungs: Clear Heart: Regular Abdomen: soft, nontender, nondistended, no obvious ascites, no peritoneal signs,  normal bowel sounds. No organomegaly. Extremities: No edema Psychiatric: alert and oriented x3. Cooperative     ASSESSMENT:  1.  History of sessile serrated polyp.  Due for surveillance   PLAN:   1.  Surveillance colonoscopy

## 2021-02-06 ENCOUNTER — Telehealth: Payer: Self-pay | Admitting: *Deleted

## 2021-02-06 ENCOUNTER — Encounter: Payer: Self-pay | Admitting: Internal Medicine

## 2021-02-06 NOTE — Telephone Encounter (Signed)
  Follow up Call-  Call back number 02/04/2021  Post procedure Call Back phone  # [phone]  Permission to leave phone message Yes  Some recent data might be hidden     Patient questions:  Do you have a fever, pain , or abdominal swelling? No. Pain Score  0 *  Have you tolerated food without any problems? Yes.    Have you been able to return to your normal activities? Yes.    Do you have any questions about your discharge instructions: Diet   No. Medications  No. Follow up visit  No.  Do you have questions or concerns about your Care? No.  Actions: * If pain score is 4 or above: No action needed, pain <4.Have you developed a fever since your procedure? no  2.   Have you had an respiratory symptoms (SOB or cough) since your procedure? no  3.   Have you tested positive for COVID 19 since your procedure no  4.   Have you had any family members/close contacts diagnosed with the COVID 19 since your procedure?  no   If yes to any of these questions please route to Joylene John, RN and Joella Prince, RN

## 2021-02-27 ENCOUNTER — Telehealth: Payer: Self-pay | Admitting: Orthopedic Surgery

## 2021-02-27 NOTE — Telephone Encounter (Signed)
Pt called stating he is having back pain and has an appt on 03/12/21 but would like to know if Dr.Dean could see him sooner. I did let him know as of right now that's the soonest but he can always call periodically to check for cancellations; pt asked I still send in a message.   [phone]

## 2021-02-27 NOTE — Telephone Encounter (Signed)
Tried calling. No answer. LMVM advising I had discussed with Dr Marlou Sa and he was willing to see patient today as a work in

## 2021-03-05 ENCOUNTER — Other Ambulatory Visit: Payer: Self-pay

## 2021-03-05 ENCOUNTER — Ambulatory Visit: Payer: BC Managed Care – PPO | Admitting: Orthopedic Surgery

## 2021-03-05 ENCOUNTER — Ambulatory Visit: Payer: Self-pay

## 2021-03-05 DIAGNOSIS — M545 Low back pain, unspecified: Secondary | ICD-10-CM

## 2021-03-09 ENCOUNTER — Encounter: Payer: Self-pay | Admitting: Orthopedic Surgery

## 2021-03-09 NOTE — Progress Notes (Signed)
Office Visit Note   Patient: Troy Gentry           Date of Birth: 12-Nov-1970           MRN: [id number] Visit Date: 03/05/2021 Requested by: Shon Baton, MD [address] [place],  [place] [address] PCP: Shon Baton, MD  Subjective: Chief Complaint  Patient presents with   Lower Back - Pain    HPI: Gabe is a 50 year old patient with back pain.  Been on and off for several years.  Usually worse after golf or lifting heavy objects.  Has leg pain with occasional radiation into the buttock on the right-hand side.  Pain does wake him from sleep at night.  He tried massage which helps decrease the pressure.  Tylenol and ibuprofen also helps some.  However he has had years of pain.  Usually flares up 1-2 times a year with symptoms lasting about 2 to 3 weeks.  He does report pain with sitting but then standing is helpful to alleviate the symptoms.  No prior history of surgery on the back.  He likes to do golf boating and racquet sports.              ROS: All systems reviewed are negative as they relate to the chief complaint within the history of present illness.  Patient denies  fevers or chills.   Assessment & Plan: Visit Diagnoses:  1. 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  He has had years of symptoms worse over the past year.  Symptoms are now starting to interfere with his ADLs and sleep.  He has tried a fitness and stretching program for least 6 to 8 weeks without positive results.  This focused on hamstring stretching which is reinforced today.  Nonetheless MRI scan indicated to pave the way for episodic epidural steroid injections in order to diminish the duration of symptoms after 1 of these flares.  Follow-Up Instructions: Return for after MRI.   Orders:  Orders Placed This Encounter  Procedures   XR  Lumbar Spine 2-3 Views   MR Lumbar Spine w/o contrast   No orders of the defined types were placed in this encounter.     Procedures: No procedures performed   Clinical Data: No additional findings.  Objective: Vital Signs: There were no vitals taken for this visit.  Physical Exam:   Constitutional: Patient appears well-developed HEENT:  Head: Normocephalic Eyes:EOM are normal Neck: Normal range of motion Cardiovascular: Normal rate Pulmonary/chest: Effort normal Neurologic: Patient is alert Skin: Skin is warm Psychiatric: Patient has normal mood and affect   Ortho Exam: Ortho exam demonstrates excellent muscle tone in the legs.  No effusion in either knee.  Pedal pulses palpable.  No nerve root tension signs.  No groin pain with internal ex rotation of the leg.  No trochanteric tenderness.  No masses lymphadenopathy or skin changes noted in that back region.  Specialty Comments:  No specialty comments available.  Imaging: No results found.   PMFS History: Patient Active Problem List   Diagnosis Date Noted   Routine health maintenance 02/20/2012   Past Medical History:  Diagnosis Date   Avulsion of hamstring muscle 2012    Family History  Problem Relation Age of Onset   Arthritis Mother    Heart disease Father    Hypertension Father  Hyperlipidemia Father    Sleep apnea Father    Colitis Sister    Diabetes Sister    Sleep apnea Sister    Cancer Paternal Uncle        colon   Heart disease Maternal Grandfather        CAD   Cancer Paternal Grandmother        colon   Diabetes Paternal Grandmother    Cancer Paternal Grandfather        colon   Colon cancer Paternal Grandfather    Colon polyps Neg Hx    Esophageal cancer Neg Hx    Stomach cancer Neg Hx    Rectal cancer Neg Hx     Past Surgical History:  Procedure Laterality Date   COLONOSCOPY     EYE SURGERY     Lasik   KNEE ARTHROSCOPY Left    x 2   KNEE ARTHROSCOPY W/ ACL RECONSTRUCTION      '95, '06 left knee   SHOULDER ARTHROSCOPY Right 06/17/2015   Procedure: DIAGNOSTIC OPERATIVE RIGHT SHOULDER ARTHROSCOPY, BICEPS RELEASE WITH BICEPS TENODESIS, UPPER SUBSCAPULARIS REPAIR;  Surgeon: Meredith Pel, MD;  Location: [facility];  Service: Orthopedics;  Laterality: Right;   Social History   Occupational History   Occupation: Environmental health practitioner  Tobacco Use   Smoking status: Never   Smokeless tobacco: Never  Vaping Use   Vaping Use: Never used  Substance and Sexual Activity   Alcohol use: Not Currently    Alcohol/week: 3.0 standard drinks    Types: 3 Standard drinks or equivalent per week   Drug use: No   Sexual activity: Yes    Partners: Female

## 2021-03-12 ENCOUNTER — Ambulatory Visit: Payer: BC Managed Care – PPO | Admitting: Orthopedic Surgery

## 2021-03-20 ENCOUNTER — Other Ambulatory Visit: Payer: Self-pay

## 2021-03-20 ENCOUNTER — Ambulatory Visit
Admission: RE | Admit: 2021-03-20 | Discharge: 2021-03-20 | Disposition: A | Discharge status: Home / Self Care | Payer: BC Managed Care – PPO | Source: Ambulatory Visit | Attending: Orthopedic Surgery | Admitting: Orthopedic Surgery

## 2021-03-20 DIAGNOSIS — M545 Low back pain, unspecified: Secondary | ICD-10-CM

## 2021-03-21 ENCOUNTER — Other Ambulatory Visit: Payer: Self-pay

## 2021-03-21 DIAGNOSIS — Z23 Encounter for immunization: Secondary | ICD-10-CM | POA: Diagnosis not present

## 2021-03-21 DIAGNOSIS — M545 Low back pain, unspecified: Secondary | ICD-10-CM

## 2021-03-21 NOTE — Progress Notes (Signed)
Hi Lauren can you set up Vonna Kotyk for a back injection with Dr. Ernestina Patches next week.  I called him and discussed his scan results over the phone.  Thanks

## 2021-03-24 ENCOUNTER — Telehealth: Payer: Self-pay | Admitting: Physical Medicine and Rehabilitation

## 2021-03-24 NOTE — Telephone Encounter (Signed)
Pt calling and asking about status of referral with [facility].  CB [phone]

## 2021-03-26 ENCOUNTER — Ambulatory Visit (INDEPENDENT_AMBULATORY_CARE_PROVIDER_SITE_OTHER): Payer: BC Managed Care – PPO | Admitting: Physical Medicine and Rehabilitation

## 2021-03-26 ENCOUNTER — Other Ambulatory Visit: Payer: Self-pay

## 2021-03-26 ENCOUNTER — Ambulatory Visit: Payer: Self-pay

## 2021-03-26 ENCOUNTER — Encounter: Payer: Self-pay | Admitting: Physical Medicine and Rehabilitation

## 2021-03-26 VITALS — BP 120/79 | HR 76

## 2021-03-26 DIAGNOSIS — M5416 Radiculopathy, lumbar region: Secondary | ICD-10-CM

## 2021-03-26 MED ORDER — BETAMETHASONE SOD PHOS & ACET 6 (3-3) MG/ML IJ SUSP
12.0000 mg | Freq: Once | INTRAMUSCULAR | Status: AC
  Administered 2021-03-26: 12 mg

## 2021-03-26 NOTE — Patient Instructions (Signed)

## 2021-03-26 NOTE — Progress Notes (Signed)
Pt state lower back pain that travels to his right buttocks. Pt state sitting makes the pain worse. Pt state he takes over the counter pain meds to help ease his pain.  Numeric Pain Rating Scale and Functional Assessment Average Pain 5   In the last MONTH (on 0-10 scale) has pain interfered with the following?  1. General activity like being  able to carry out your everyday physical activities such as walking, climbing stairs, carrying groceries, or moving a chair?  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

## 2021-03-27 NOTE — Procedures (Signed)
Lumbar Epidural Steroid Injection - Interlaminar Approach with Fluoroscopic Guidance  Patient: Troy Gentry      Date of Birth: [birth year] MRN: [id number] PCP: Shon Baton, MD      Visit Date: 03/26/2021   Universal Protocol:     Consent Given By: the patient  Position: PRONE  Additional Comments: Vital signs were monitored before and after the procedure. Patient was prepped and draped in the usual sterile fashion. The correct patient, procedure, and site was verified.   Injection Procedure Details:   Procedure diagnoses: Lumbar radiculopathy [M54.16]   Meds Administered:  Meds ordered this encounter  Medications   betamethasone acetate-betamethasone sodium phosphate (CELESTONE) injection 12 mg     Laterality: Right  Location/Site:  L5-S1  Needle: 3.5 in., 20 ga. Tuohy  Needle Placement: Paramedian epidural  Findings:   -Comments: Excellent flow of contrast into the epidural space.  Procedure Details: Using a paramedian approach from the side mentioned above, the region overlying the inferior lamina was localized under fluoroscopic visualization and the soft tissues overlying this structure were infiltrated with 4 ml. of 1% Lidocaine without Epinephrine. The Tuohy needle was inserted into the epidural space using a paramedian approach.   The epidural space was localized using loss of resistance along with counter oblique bi-planar fluoroscopic views.  After negative aspirate for air, blood, and CSF, a 2 ml. volume of Isovue-250 was injected into the epidural space and the flow of contrast was observed. Radiographs were obtained for documentation purposes.    The injectate was administered into the level noted above.   Additional Comments:  The patient tolerated the procedure well Dressing: 2 x 2 sterile gauze and Band-Aid    Post-procedure details: Patient was observed during the procedure. Post-procedure instructions were reviewed.  Patient left the clinic  in stable condition.

## 2021-03-27 NOTE — Progress Notes (Signed)
Troy Gentry - 50 y.o. male MRN [id number]  Date of birth: 1970-11-20  Office Visit Note: Visit Date: 03/26/2021 PCP: Shon Baton, MD Referred by: Shon Baton, MD  Subjective: Chief Complaint  Patient presents with   Lower Back - Pain   HPI:  Troy Gentry is a 50 y.o. male who comes in today at the request of Dr. Anderson Malta for planned Right L5-S1 Lumbar Interlaminar epidural steroid injection with fluoroscopic guidance.  The patient has failed conservative care including home exercise, medications, time and activity modification.  This injection will be diagnostic and hopefully therapeutic.  Please see requesting physician notes for further details and justification. MRI reviewed with images and spine model.  MRI reviewed in the note below.    ROS Otherwise per HPI.  Assessment & Plan: Visit Diagnoses:    ICD-10-CM   1. Lumbar radiculopathy  M54.16 XR C-ARM NO REPORT    Epidural Steroid injection    betamethasone acetate-betamethasone sodium phosphate (CELESTONE) injection 12 mg      Plan: No additional findings.   Meds & Orders:  Meds ordered this encounter  Medications   betamethasone acetate-betamethasone sodium phosphate (CELESTONE) injection 12 mg    Orders Placed This Encounter  Procedures   XR C-ARM NO REPORT   Epidural Steroid injection    Follow-up: Return for visit to requesting physician as needed.   Procedures: No procedures performed  Lumbar Epidural Steroid Injection - Interlaminar Approach with Fluoroscopic Guidance  Patient: Troy Gentry      Date of Birth: March 13, 1971 MRN: [id number] PCP: Shon Baton, MD      Visit Date: 03/26/2021   Universal Protocol:     Consent Given By: the patient  Position: PRONE  Additional Comments: Vital signs were monitored before and after the procedure. Patient was prepped and draped in the usual sterile fashion. The correct patient, procedure, and site was verified.   Injection Procedure Details:    Procedure diagnoses: Lumbar radiculopathy [M54.16]   Meds Administered:  Meds ordered this encounter  Medications   betamethasone acetate-betamethasone sodium phosphate (CELESTONE) injection 12 mg     Laterality: Right  Location/Site:  L5-S1  Needle: 3.5 in., 20 ga. Tuohy  Needle Placement: Paramedian epidural  Findings:   -Comments: Excellent flow of contrast into the epidural space.  Procedure Details: Using a paramedian approach from the side mentioned above, the region overlying the inferior lamina was localized under fluoroscopic visualization and the soft tissues overlying this structure were infiltrated with 4 ml. of 1% Lidocaine without Epinephrine. The Tuohy needle was inserted into the epidural space using a paramedian approach.   The epidural space was localized using loss of resistance along with counter oblique bi-planar fluoroscopic views.  After negative aspirate for air, blood, and CSF, a 2 ml. volume of Isovue-250 was injected into the epidural space and the flow of contrast was observed. Radiographs were obtained for documentation purposes.    The injectate was administered into the level noted above.   Additional Comments:  The patient tolerated the procedure well Dressing: 2 x 2 sterile gauze and Band-Aid    Post-procedure details: Patient was observed during the procedure. Post-procedure instructions were reviewed.  Patient left the clinic in stable condition.   Clinical History: MRI LUMBAR SPINE WITHOUT CONTRAST   TECHNIQUE: Multiplanar, multisequence MR imaging of the lumbar spine was performed. No intravenous contrast was administered.   COMPARISON:  No prior MRI   FINDINGS: Segmentation:  Standard.   Alignment:  Trace retrolisthesis L5 on S1.  Otherwise normal.   Vertebrae:  No fracture, evidence of discitis, or bone lesion.   Conus medullaris and cauda equina: Conus extends to the L2 level. Conus and cauda equina appear normal.    Paraspinal and other soft tissues: Choose 1   Disc levels:   T12-L1: No significant disc bulge. No spinal canal stenosis or neural foraminal narrowing.   L1-L2: No significant disc bulge. No spinal canal stenosis or neural foraminal narrowing.   L2-L3: No significant disc bulge. No spinal canal stenosis or neural foraminal narrowing.   L3-L4: Mild disc desiccation and mild disc bulge. Mild facet arthropathy. Mild narrowing of the lateral recesses. No spinal canal stenosis. Mild bilateral neural foraminal narrowing.   L4-L5: Broad-based disc bulge with superimposed central disc extrusion, with 4 mm of caudal migration. Mild facet arthropathy. No spinal canal stenosis. Narrowing of the left greater than right lateral recess. Mild left and moderate right neural foraminal narrowing.   L5-S1: Trace retrolisthesis and broad-based disc bulge. Moderate facet arthropathy. No spinal canal stenosis. Moderate bilateral neural foraminal narrowing.   IMPRESSION: 1. L5-S1 moderate bilateral neural foraminal narrowing. 2. L4-L5 moderate right and mild left neural foraminal narrowing. Narrowing of the left-greater-than-right lateral recess at this level could affect the descending L5 nerves. 3. L3-L4 mild bilateral neural foraminal narrowing. Mild narrowing of the lateral recesses at this level could affect the descending L4 nerves. 4. No spinal canal stenosis.     Electronically Signed   By: Merilyn Baba M.D.   On: 03/21/2021 01:56     Objective:  VS:  HT:    WT:   BMI:     BP:120/79  HR:76bpm  TEMP: ( )  RESP:  Physical Exam Vitals and nursing note reviewed.  Constitutional:      General: He is not in acute distress.    Appearance: Normal appearance. He is not ill-appearing.  HENT:     Head: Normocephalic and atraumatic.     Right Ear: External ear normal.     Left Ear: External ear normal.     Nose: No congestion.  Eyes:     Extraocular Movements: Extraocular  movements intact.  Cardiovascular:     Rate and Rhythm: Normal rate.     Pulses: Normal pulses.  Pulmonary:     Effort: Pulmonary effort is normal. No respiratory distress.  Abdominal:     General: There is no distension.     Palpations: Abdomen is soft.  Musculoskeletal:        General: No tenderness or signs of injury.     Cervical back: Neck supple.     Right lower leg: No edema.     Left lower leg: No edema.     Comments: Patient has good distal strength without clonus.  Skin:    Findings: No erythema or rash.  Neurological:     General: No focal deficit present.     Mental Status: He is alert and oriented to person, place, and time.     Sensory: No sensory deficit.     Motor: No weakness or abnormal muscle tone.     Coordination: Coordination normal.  Psychiatric:        Mood and Affect: Mood normal.        Behavior: Behavior normal.     Imaging: No results found.

## 2021-06-09 ENCOUNTER — Telehealth: Payer: Self-pay | Admitting: Physical Medicine and Rehabilitation

## 2021-06-09 NOTE — Telephone Encounter (Signed)
Authorization needed?

## 2021-06-09 NOTE — Telephone Encounter (Signed)
Pt called and states he would like to repeat Right L5-S1 interlam. Last injection was 03/26/2021 and he said it helped 80%.   CB  [phone]

## 2021-06-13 DIAGNOSIS — M9902 Segmental and somatic dysfunction of thoracic region: Secondary | ICD-10-CM | POA: Diagnosis not present

## 2021-06-13 DIAGNOSIS — M5442 Lumbago with sciatica, left side: Secondary | ICD-10-CM | POA: Diagnosis not present

## 2021-06-13 DIAGNOSIS — M9903 Segmental and somatic dysfunction of lumbar region: Secondary | ICD-10-CM | POA: Diagnosis not present

## 2021-06-13 DIAGNOSIS — M9904 Segmental and somatic dysfunction of sacral region: Secondary | ICD-10-CM | POA: Diagnosis not present

## 2021-06-16 ENCOUNTER — Telehealth: Payer: Self-pay | Admitting: Orthopedic Surgery

## 2021-06-16 NOTE — Telephone Encounter (Signed)
scheduled

## 2021-06-16 NOTE — Telephone Encounter (Signed)
Pt called and states he is in a lot of pain still with his low back pain. He says it is starting to go down his left leg. He was wondering if he could see Marlou Sa this week because he will be out of town next week.   CB [phone]

## 2021-06-17 DIAGNOSIS — M9903 Segmental and somatic dysfunction of lumbar region: Secondary | ICD-10-CM | POA: Diagnosis not present

## 2021-06-17 DIAGNOSIS — M5442 Lumbago with sciatica, left side: Secondary | ICD-10-CM | POA: Diagnosis not present

## 2021-06-17 DIAGNOSIS — M9902 Segmental and somatic dysfunction of thoracic region: Secondary | ICD-10-CM | POA: Diagnosis not present

## 2021-06-17 DIAGNOSIS — M9904 Segmental and somatic dysfunction of sacral region: Secondary | ICD-10-CM | POA: Diagnosis not present

## 2021-06-19 ENCOUNTER — Other Ambulatory Visit: Payer: Self-pay

## 2021-06-19 ENCOUNTER — Ambulatory Visit: Payer: BC Managed Care – PPO | Admitting: Orthopedic Surgery

## 2021-06-19 DIAGNOSIS — M545 Low back pain, unspecified: Secondary | ICD-10-CM

## 2021-06-19 MED ORDER — GABAPENTIN 100 MG PO CAPS: 100.0000 mg | ORAL_CAPSULE | Freq: Two times a day (BID) | ORAL | 0 refills | Status: AC

## 2021-06-22 ENCOUNTER — Encounter: Payer: Self-pay | Admitting: Orthopedic Surgery

## 2021-06-22 NOTE — Progress Notes (Addendum)
Office Visit Note   Patient: Troy Gentry           Date of Birth: [birth year]           MRN: [id number] Visit Date: 06/19/2021 Requested by: Shon Baton, MD [address] [place],  [place] [address] PCP: Shon Baton, MD  Subjective: Chief Complaint  Patient presents with   Lower Back - Pain    HPI: Troy Gentry is a 51 year old patient with low back pain.  He has had a flareup of low back pain a few weeks ago but it did not resolve like it does normally.  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  He was having back pain affecting the right-hand side but now the pain is affecting the entire left leg beginning posteriorly and radiating around anteriorly below the knee.  This has been incredibly painful.  Chiropractor has not been helpful.  Happened about 2 weeks ago.  The left leg pain is new.  He has been taking a prednisone Dosepak.  Mobic also helps some.  Pain is worse in the morning.  He has tried an elliptical which helps.  Sitting and laying makes his symptoms worse.  Notably he did have a gout flare 1 week before his symptoms increased but that improved and resolved essentially with colchicine treatment.  He has been trying to stretch.  He also has tramadol.  We reviewed the MRI scan and he does have disc desiccation at L4-5 with small bulge.  I think that has likely worsened since October.              ROS: All systems reviewed are negative as they relate to the chief complaint within the history of present illness.  Patient denies  fevers or chills.   Assessment & Plan: Visit Diagnoses:  1. Low back pain, unspecified back pain laterality, unspecified chronicity, unspecified whether sciatica present     Plan: Impression is low back pain with likely worsening of disc bulge which was predominantly left-sided on the scan from October.  He has tried conservative measures.  Another injection is indicated.  This could become a surgical problem but we  would need to repeat the scan before considering surgical intervention.  Neurontin will be tried as in addition to his prednisone and Mobic.  Best to hold off on taking Mobic with the steroid.  We will try to get him into see Dr. Ernestina Patches for an injection as soon as possible.  No weakness today or intractable pain.  We will add Neurontin as well to his pain regimen.  Follow-Up Instructions: No follow-ups on file.   Orders:  No orders of the defined types were placed in this encounter.  Meds ordered this encounter  Medications   gabapentin (NEURONTIN) 100 MG capsule    Sig: Take 1 capsule (100 mg total) by mouth 2 (two) times daily.    Dispense:  30 capsule    Refill:  0      Procedures: No procedures performed   Clinical Data: No additional findings.  Objective: Vital Signs: There were no vitals taken for this visit.  Physical Exam:   Constitutional: Patient appears well-developed HEENT:  Head: Normocephalic Eyes:EOM are normal Neck: Normal range of motion Cardiovascular: Normal rate Pulmonary/chest: Effort normal Neurologic: Patient is alert Skin: Skin is warm Psychiatric: Patient has normal mood and affect   Ortho Exam: Ortho exam demonstrates full active and passive range of motion of the ankles knees  and hips.  Mildly positive nerve root tension sign on the left negative on the right.  Patient has 5 out of 5 ankle dorsiflexion plantarflexion quad and hamstring strength.  Appears physically fit with no atrophy in either leg.  Mild pain with forward lateral bending.  No definite paresthesias L1 S1 bilaterally.  Reflexes 0 to 1+ out of 4 bilateral patella and Achilles.  Specialty Comments:  No specialty comments available.  Imaging: No results found.   PMFS History: Patient Active Problem List   Diagnosis Date Noted   Routine health maintenance 02/20/2012   Past Medical History:  Diagnosis Date   Avulsion of hamstring muscle 2012    Family History  Problem  Relation Age of Onset   Arthritis Mother    Heart disease Father    Hypertension Father    Hyperlipidemia Father    Sleep apnea Father    Colitis Sister    Diabetes Sister    Sleep apnea Sister    Cancer Paternal Uncle        colon   Heart disease Maternal Grandfather        CAD   Cancer Paternal Grandmother        colon   Diabetes Paternal Grandmother    Cancer Paternal Grandfather        colon   Colon cancer Paternal Grandfather    Colon polyps Neg Hx    Esophageal cancer Neg Hx    Stomach cancer Neg Hx    Rectal cancer Neg Hx     Past Surgical History:  Procedure Laterality Date   COLONOSCOPY     EYE SURGERY     Lasik   KNEE ARTHROSCOPY Left    x 2   KNEE ARTHROSCOPY W/ ACL RECONSTRUCTION     '95, '06 left knee   SHOULDER ARTHROSCOPY Right 06/17/2015   Procedure: DIAGNOSTIC OPERATIVE RIGHT SHOULDER ARTHROSCOPY, BICEPS RELEASE WITH BICEPS TENODESIS, UPPER SUBSCAPULARIS REPAIR;  Surgeon: Meredith Pel, MD;  Location: [facility];  Service: Orthopedics;  Laterality: Right;   Social History   Occupational History   Occupation: Environmental health practitioner  Tobacco Use   Smoking status: Never   Smokeless tobacco: Never  Vaping Use   Vaping Use: Never used  Substance and Sexual Activity   Alcohol use: Not Currently    Alcohol/week: 3.0 standard drinks    Types: 3 Standard drinks or equivalent per week   Drug use: No   Sexual activity: Yes    Partners: Female

## 2021-06-30 ENCOUNTER — Encounter: Payer: Self-pay | Admitting: Physical Medicine and Rehabilitation

## 2021-06-30 ENCOUNTER — Other Ambulatory Visit: Payer: Self-pay

## 2021-06-30 ENCOUNTER — Ambulatory Visit: Payer: Self-pay

## 2021-06-30 ENCOUNTER — Ambulatory Visit (INDEPENDENT_AMBULATORY_CARE_PROVIDER_SITE_OTHER): Payer: BC Managed Care – PPO | Admitting: Physical Medicine and Rehabilitation

## 2021-06-30 VITALS — BP 122/83 | HR 78

## 2021-06-30 DIAGNOSIS — M5116 Intervertebral disc disorders with radiculopathy, lumbar region: Secondary | ICD-10-CM

## 2021-06-30 DIAGNOSIS — M5416 Radiculopathy, lumbar region: Secondary | ICD-10-CM

## 2021-06-30 MED ORDER — METHYLPREDNISOLONE ACETATE 80 MG/ML IJ SUSP
80.0000 mg | Freq: Once | INTRAMUSCULAR | Status: AC
  Administered 2021-06-30: 80 mg

## 2021-06-30 NOTE — Procedures (Signed)
Lumbar Epidural Steroid Injection - Interlaminar Approach with Fluoroscopic Guidance  Patient: Troy Gentry      Date of Birth: June 08, 1970 MRN: [id number] PCP: Shon Baton, MD      Visit Date: 06/30/2021   Universal Protocol:     Consent Given By: the patient  Position: PRONE  Additional Comments: Vital signs were monitored before and after the procedure. Patient was prepped and draped in the usual sterile fashion. The correct patient, procedure, and site was verified.   Injection Procedure Details:   Procedure diagnoses: Lumbar radiculopathy [M54.16]   Meds Administered:  Meds ordered this encounter  Medications   methylPREDNISolone acetate (DEPO-MEDROL) injection 80 mg     Laterality: Left  Location/Site:  L5-S1  Needle: 3.5 in., 20 ga. Tuohy  Needle Placement: Paramedian epidural  Findings:   -Comments: Excellent flow of contrast into the epidural space.  Procedure Details: Using a paramedian approach from the side mentioned above, the region overlying the inferior lamina was localized under fluoroscopic visualization and the soft tissues overlying this structure were infiltrated with 4 ml. of 1% Lidocaine without Epinephrine. The Tuohy needle was inserted into the epidural space using a paramedian approach.   The epidural space was localized using loss of resistance along with counter oblique bi-planar fluoroscopic views.  After negative aspirate for air, blood, and CSF, a 2 ml. volume of Isovue-250 was injected into the epidural space and the flow of contrast was observed. Radiographs were obtained for documentation purposes.    The injectate was administered into the level noted above.   Additional Comments:  No complications occurred Dressing: 2 x 2 sterile gauze and Band-Aid    Post-procedure details: Patient was observed during the procedure. Post-procedure instructions were reviewed.  Patient left the clinic in stable condition.

## 2021-06-30 NOTE — Progress Notes (Signed)
JACOBE STUDY - 51 y.o. male MRN [id number]  Date of birth: 12/22/1970  Office Visit Note: Visit Date: 06/30/2021 PCP: Shon Baton, MD Referred by: Shon Baton, MD  Subjective: Chief Complaint  Patient presents with   Lower Back - Pain   Right Leg - Pain   HPI:  Troy Gentry is a 51 y.o. male who comes in today at the request of Dr. Anderson Malta for planned Left L5-S1 Lumbar Interlaminar epidural steroid injection with fluoroscopic guidance.  The patient has failed conservative care including home exercise, medications, time and activity modification.  This injection will be diagnostic and hopefully therapeutic.  Please see requesting physician notes for further details and justification.  He has been doing core strengthening prior to this recent flareup.  He is asking about formal physical therapy and I did write a prescription to Dr. Harrel Lemon [facility] physical therapy.  Patient is seen him in the past.  We did talk about surgical approaches such as microdiscectomy etc.  I do want to see him back in a few weeks for possible transforaminal approach depending on the relief he gets with this injection.  ROS Otherwise per HPI.  Assessment & Plan: Visit Diagnoses:    ICD-10-CM   1. Lumbar radiculopathy  M54.16 XR C-ARM NO REPORT    Epidural Steroid injection    methylPREDNISolone acetate (DEPO-MEDROL) injection 80 mg    Ambulatory referral to Physical Medicine Rehab    Ambulatory referral to Physical Therapy    2. Radiculopathy due to lumbar intervertebral disc disorder  M51.16 Ambulatory referral to Physical Therapy      Plan: No additional findings.   Meds & Orders:  Meds ordered this encounter  Medications   methylPREDNISolone acetate (DEPO-MEDROL) injection 80 mg    Orders Placed This Encounter  Procedures   XR C-ARM NO REPORT   Ambulatory referral to Physical Medicine Rehab   Ambulatory referral to Physical Therapy   Epidural Steroid injection     Follow-up: Return in 2 weeks (on 07/14/2021) for Left L5 transforaminal epidural injection.   Procedures: No procedures performed  Lumbar Epidural Steroid Injection - Interlaminar Approach with Fluoroscopic Guidance  Patient: Troy Gentry      Date of Birth: 03-Aug-1970 MRN: [id number] PCP: Shon Baton, MD      Visit Date: 06/30/2021   Universal Protocol:     Consent Given By: the patient  Position: PRONE  Additional Comments: Vital signs were monitored before and after the procedure. Patient was prepped and draped in the usual sterile fashion. The correct patient, procedure, and site was verified.   Injection Procedure Details:   Procedure diagnoses: Lumbar radiculopathy [M54.16]   Meds Administered:  Meds ordered this encounter  Medications   methylPREDNISolone acetate (DEPO-MEDROL) injection 80 mg     Laterality: Left  Location/Site:  L5-S1  Needle: 3.5 in., 20 ga. Tuohy  Needle Placement: Paramedian epidural  Findings:   -Comments: Excellent flow of contrast into the epidural space.  Procedure Details: Using a paramedian approach from the side mentioned above, the region overlying the inferior lamina was localized under fluoroscopic visualization and the soft tissues overlying this structure were infiltrated with 4 ml. of 1% Lidocaine without Epinephrine. The Tuohy needle was inserted into the epidural space using a paramedian approach.   The epidural space was localized using loss of resistance along with counter oblique bi-planar fluoroscopic views.  After negative aspirate for air, blood, and CSF, a 2 ml. volume of Isovue-250 was  injected into the epidural space and the flow of contrast was observed. Radiographs were obtained for documentation purposes.    The injectate was administered into the level noted above.   Additional Comments:  No complications occurred Dressing: 2 x 2 sterile gauze and Band-Aid    Post-procedure details: Patient was  observed during the procedure. Post-procedure instructions were reviewed.  Patient left the clinic in stable condition.    Clinical History: MRI LUMBAR SPINE WITHOUT CONTRAST   TECHNIQUE: Multiplanar, multisequence MR imaging of the lumbar spine was performed. No intravenous contrast was administered.   COMPARISON:  No prior MRI   FINDINGS: Segmentation:  Standard.   Alignment:  Trace retrolisthesis L5 on S1.  Otherwise normal.   Vertebrae:  No fracture, evidence of discitis, or bone lesion.   Conus medullaris and cauda equina: Conus extends to the L2 level. Conus and cauda equina appear normal.   Paraspinal and other soft tissues: Choose 1   Disc levels:   T12-L1: No significant disc bulge. No spinal canal stenosis or neural foraminal narrowing.   L1-L2: No significant disc bulge. No spinal canal stenosis or neural foraminal narrowing.   L2-L3: No significant disc bulge. No spinal canal stenosis or neural foraminal narrowing.   L3-L4: Mild disc desiccation and mild disc bulge. Mild facet arthropathy. Mild narrowing of the lateral recesses. No spinal canal stenosis. Mild bilateral neural foraminal narrowing.   L4-L5: Broad-based disc bulge with superimposed central disc extrusion, with 4 mm of caudal migration. Mild facet arthropathy. No spinal canal stenosis. Narrowing of the left greater than right lateral recess. Mild left and moderate right neural foraminal narrowing.   L5-S1: Trace retrolisthesis and broad-based disc bulge. Moderate facet arthropathy. No spinal canal stenosis. Moderate bilateral neural foraminal narrowing.   IMPRESSION: 1. L5-S1 moderate bilateral neural foraminal narrowing. 2. L4-L5 moderate right and mild left neural foraminal narrowing. Narrowing of the left-greater-than-right lateral recess at this level could affect the descending L5 nerves. 3. L3-L4 mild bilateral neural foraminal narrowing. Mild narrowing of the lateral recesses at  this level could affect the descending L4 nerves. 4. No spinal canal stenosis.     Electronically Signed   By: Merilyn Baba M.D.   On: 03/21/2021 01:56     Objective:  VS:  HT:     WT:    BMI:      BP:122/83   HR:78bpm   TEMP: ( )   RESP:  Physical Exam Vitals and nursing note reviewed.  Constitutional:      General: He is not in acute distress.    Appearance: Normal appearance. He is not ill-appearing.  HENT:     Head: Normocephalic and atraumatic.     Right Ear: External ear normal.     Left Ear: External ear normal.     Nose: No congestion.  Eyes:     Extraocular Movements: Extraocular movements intact.  Cardiovascular:     Rate and Rhythm: Normal rate.     Pulses: Normal pulses.  Pulmonary:     Effort: Pulmonary effort is normal. No respiratory distress.  Abdominal:     General: There is no distension.     Palpations: Abdomen is soft.  Musculoskeletal:        General: No tenderness or signs of injury.     Cervical back: Neck supple.     Right lower leg: No edema.     Left lower leg: No edema.     Comments: Patient has good distal strength without  clonus.  Skin:    Findings: No erythema or rash.  Neurological:     General: No focal deficit present.     Mental Status: He is alert and oriented to person, place, and time.     Sensory: No sensory deficit.     Motor: No weakness or abnormal muscle tone.     Coordination: Coordination normal.  Psychiatric:        Mood and Affect: Mood normal.        Behavior: Behavior normal.     Imaging: XR C-ARM NO REPORT  Result Date: 06/30/2021 Please see Notes tab for imaging impression.

## 2021-06-30 NOTE — Progress Notes (Signed)
Pt state lower back pain that travels to his left buttocks posterior left leg . Pt state sitting makes the pain worse. Pt state he takes over the counter pain meds to help ease his pain.  Numeric Pain Rating Scale and Functional Assessment Average Pain    In the last MONTH (on 0-10 8scale) has pain interfered with the following?  1. General activity like being  able to carry out your everyday physical activities such as walking, climbing stairs, carrying groceries, or moving a chair?  Rating(10)   +Driver, -BT, -Dye Allergies.]

## 2021-06-30 NOTE — Patient Instructions (Signed)

## 2021-07-07 ENCOUNTER — Other Ambulatory Visit: Payer: Self-pay | Admitting: Internal Medicine

## 2021-07-07 DIAGNOSIS — E785 Hyperlipidemia, unspecified: Secondary | ICD-10-CM

## 2021-07-08 DIAGNOSIS — M5451 Vertebrogenic low back pain: Secondary | ICD-10-CM | POA: Diagnosis not present

## 2021-07-18 DIAGNOSIS — M5451 Vertebrogenic low back pain: Secondary | ICD-10-CM | POA: Diagnosis not present

## 2021-07-21 ENCOUNTER — Encounter: Payer: Self-pay | Admitting: Physical Medicine and Rehabilitation

## 2021-07-21 ENCOUNTER — Ambulatory Visit: Payer: Self-pay

## 2021-07-21 ENCOUNTER — Ambulatory Visit (INDEPENDENT_AMBULATORY_CARE_PROVIDER_SITE_OTHER): Payer: BC Managed Care – PPO | Admitting: Physical Medicine and Rehabilitation

## 2021-07-21 ENCOUNTER — Other Ambulatory Visit: Payer: Self-pay

## 2021-07-21 VITALS — BP 134/84 | HR 65

## 2021-07-21 DIAGNOSIS — M5416 Radiculopathy, lumbar region: Secondary | ICD-10-CM | POA: Diagnosis not present

## 2021-07-21 MED ORDER — METHYLPREDNISOLONE ACETATE 80 MG/ML IJ SUSP
80.0000 mg | Freq: Once | INTRAMUSCULAR | Status: AC
  Administered 2021-07-21: 80 mg

## 2021-07-21 MED ORDER — PREDNISONE 50 MG PO TABS: ORAL_TABLET | ORAL | 0 refills | Status: AC

## 2021-07-21 NOTE — Patient Instructions (Signed)

## 2021-07-21 NOTE — Procedures (Signed)
Lumbar Epidural Steroid Injection - Interlaminar Approach with Fluoroscopic Guidance  Patient: Troy Gentry      Date of Birth: 04-21-1971 MRN: [id number] PCP: Shon Baton, MD      Visit Date: 07/21/2021   Universal Protocol:     Consent Given By: the patient  Position: PRONE  Additional Comments: Vital signs were monitored before and after the procedure. Patient was prepped and draped in the usual sterile fashion. The correct patient, procedure, and site was verified.   Injection Procedure Details:   Procedure diagnoses: Lumbar radiculopathy [M54.16]   Meds Administered:  Meds ordered this encounter  Medications   methylPREDNISolone acetate (DEPO-MEDROL) injection 80 mg     Laterality: Left  Location/Site:  L5-S1  Needle: 3.5 in., 20 ga. Tuohy  Needle Placement: Paramedian epidural  Findings:   -Comments: Excellent flow of contrast into the epidural space.  Procedure Details: Using a paramedian approach from the side mentioned above, the region overlying the inferior lamina was localized under fluoroscopic visualization and the soft tissues overlying this structure were infiltrated with 4 ml. of 1% Lidocaine without Epinephrine. The Tuohy needle was inserted into the epidural space using a paramedian approach.   The epidural space was localized using loss of resistance along with counter oblique bi-planar fluoroscopic views.  After negative aspirate for air, blood, and CSF, a 2 ml. volume of Isovue-250 was injected into the epidural space and the flow of contrast was observed. Radiographs were obtained for documentation purposes.    The injectate was administered into the level noted above.   Additional Comments:  The patient tolerated the procedure well Dressing: 2 x 2 sterile gauze and Band-Aid    Post-procedure details: Patient was observed during the procedure. Post-procedure instructions were reviewed.  Patient left the clinic in stable condition.

## 2021-07-21 NOTE — Progress Notes (Signed)
Pt state lower back pain that travels to his left buttocks posterior left leg . Pt state sitting makes the pain worse. Pt state he takes over the counter pain meds to help ease his pain  Numeric Pain Rating Scale and Functional Assessment Average Pain 3   In the last MONTH (on 0-10 scale) has pain interfered with the following?  1. General activity like being  able to carry out your everyday physical activities such as walking, climbing stairs, carrying groceries, or moving a chair?  Rating(9)   +Driver, -BT, -Dye Allergies.

## 2021-07-21 NOTE — Progress Notes (Signed)
KIEFER OPHEIM - 51 y.o. male MRN [id number]  Date of birth: 1970-07-11  Office Visit Note: Visit Date: 07/21/2021 PCP: Shon Baton, MD Referred by: Shon Baton, MD  Subjective: Chief Complaint  Patient presents with   Lower Back - Pain   Left Leg - Pain   HPI:  BEREKET GERNERT is a 51 y.o. male who comes in today for planned repeat Left L5-S1  Lumbar Interlaminar epidural steroid injection with fluoroscopic guidance.  The patient has failed conservative care including home exercise, medications, time and activity modification.  This injection will be diagnostic and hopefully therapeutic.  Please see requesting physician notes for further details and justification. Patient received more than 50% pain relief from prior injection. MRI reviewed with images and spine model.  MRI reviewed in the note below.   Referring: Dr. Landry Dyke Dean   ROS Otherwise per HPI.  Assessment & Plan: Visit Diagnoses:    ICD-10-CM   1. Lumbar radiculopathy  M54.16 XR C-ARM NO REPORT    Epidural Steroid injection    methylPREDNISolone acetate (DEPO-MEDROL) injection 80 mg      Plan: No additional findings.   Meds & Orders:  Meds ordered this encounter  Medications   methylPREDNISolone acetate (DEPO-MEDROL) injection 80 mg    Orders Placed This Encounter  Procedures   XR C-ARM NO REPORT   Epidural Steroid injection    Follow-up: Return if symptoms worsen or fail to improve.   Procedures: No procedures performed  Lumbar Epidural Steroid Injection - Interlaminar Approach with Fluoroscopic Guidance  Patient: LAYMAN GULLY      Date of Birth: [birth year] MRN: [id number] PCP: Shon Baton, MD      Visit Date: 07/21/2021   Universal Protocol:     Consent Given By: the patient  Position: PRONE  Additional Comments: Vital signs were monitored before and after the procedure. Patient was prepped and draped in the usual sterile fashion. The correct patient, procedure, and site was  verified.   Injection Procedure Details:   Procedure diagnoses: Lumbar radiculopathy [M54.16]   Meds Administered:  Meds ordered this encounter  Medications   methylPREDNISolone acetate (DEPO-MEDROL) injection 80 mg     Laterality: Left  Location/Site:  L5-S1  Needle: 3.5 in., 20 ga. Tuohy  Needle Placement: Paramedian epidural  Findings:   -Comments: Excellent flow of contrast into the epidural space.  Procedure Details: Using a paramedian approach from the side mentioned above, the region overlying the inferior lamina was localized under fluoroscopic visualization and the soft tissues overlying this structure were infiltrated with 4 ml. of 1% Lidocaine without Epinephrine. The Tuohy needle was inserted into the epidural space using a paramedian approach.   The epidural space was localized using loss of resistance along with counter oblique bi-planar fluoroscopic views.  After negative aspirate for air, blood, and CSF, a 2 ml. volume of Isovue-250 was injected into the epidural space and the flow of contrast was observed. Radiographs were obtained for documentation purposes.    The injectate was administered into the level noted above.   Additional Comments:  The patient tolerated the procedure well Dressing: 2 x 2 sterile gauze and Band-Aid    Post-procedure details: Patient was observed during the procedure. Post-procedure instructions were reviewed.  Patient left the clinic in stable condition.    Clinical History: MRI LUMBAR SPINE WITHOUT CONTRAST   TECHNIQUE: Multiplanar, multisequence MR imaging of the lumbar spine was performed. No intravenous contrast was administered.   COMPARISON:  No  prior MRI   FINDINGS: Segmentation:  Standard.   Alignment:  Trace retrolisthesis L5 on S1.  Otherwise normal.   Vertebrae:  No fracture, evidence of discitis, or bone lesion.   Conus medullaris and cauda equina: Conus extends to the L2 level. Conus and cauda equina  appear normal.   Paraspinal and other soft tissues: Choose 1   Disc levels:   T12-L1: No significant disc bulge. No spinal canal stenosis or neural foraminal narrowing.   L1-L2: No significant disc bulge. No spinal canal stenosis or neural foraminal narrowing.   L2-L3: No significant disc bulge. No spinal canal stenosis or neural foraminal narrowing.   L3-L4: Mild disc desiccation and mild disc bulge. Mild facet arthropathy. Mild narrowing of the lateral recesses. No spinal canal stenosis. Mild bilateral neural foraminal narrowing.   L4-L5: Broad-based disc bulge with superimposed central disc extrusion, with 4 mm of caudal migration. Mild facet arthropathy. No spinal canal stenosis. Narrowing of the left greater than right lateral recess. Mild left and moderate right neural foraminal narrowing.   L5-S1: Trace retrolisthesis and broad-based disc bulge. Moderate facet arthropathy. No spinal canal stenosis. Moderate bilateral neural foraminal narrowing.   IMPRESSION: 1. L5-S1 moderate bilateral neural foraminal narrowing. 2. L4-L5 moderate right and mild left neural foraminal narrowing. Narrowing of the left-greater-than-right lateral recess at this level could affect the descending L5 nerves. 3. L3-L4 mild bilateral neural foraminal narrowing. Mild narrowing of the lateral recesses at this level could affect the descending L4 nerves. 4. No spinal canal stenosis.     Electronically Signed   By: Merilyn Baba M.D.   On: 03/21/2021 01:56     Objective:  VS:  HT:     WT:    BMI:      BP:134/84   HR:65bpm   TEMP: ( )   RESP:  Physical Exam Vitals and nursing note reviewed.  Constitutional:      General: He is not in acute distress.    Appearance: Normal appearance. He is not ill-appearing.  HENT:     Head: Normocephalic and atraumatic.     Right Ear: External ear normal.     Left Ear: External ear normal.     Nose: No congestion.  Eyes:     Extraocular Movements:  Extraocular movements intact.  Cardiovascular:     Rate and Rhythm: Normal rate.     Pulses: Normal pulses.  Pulmonary:     Effort: Pulmonary effort is normal. No respiratory distress.  Abdominal:     General: There is no distension.     Palpations: Abdomen is soft.  Musculoskeletal:        General: No tenderness or signs of injury.     Cervical back: Neck supple.     Right lower leg: No edema.     Left lower leg: No edema.     Comments: Patient has good distal strength without clonus.  Skin:    Findings: No erythema or rash.  Neurological:     General: No focal deficit present.     Mental Status: He is alert and oriented to person, place, and time.     Sensory: No sensory deficit.     Motor: No weakness or abnormal muscle tone.     Coordination: Coordination normal.  Psychiatric:        Mood and Affect: Mood normal.        Behavior: Behavior normal.     Imaging: No results found.

## 2021-08-12 ENCOUNTER — Ambulatory Visit
Admission: RE | Admit: 2021-08-12 | Discharge: 2021-08-12 | Disposition: A | Discharge status: Home / Self Care | Payer: BC Managed Care – PPO | Source: Ambulatory Visit | Attending: Internal Medicine | Admitting: Internal Medicine

## 2021-08-12 DIAGNOSIS — Z8249 Family history of ischemic heart disease and other diseases of the circulatory system: Secondary | ICD-10-CM | POA: Diagnosis not present

## 2021-08-12 DIAGNOSIS — E785 Hyperlipidemia, unspecified: Secondary | ICD-10-CM

## 2021-08-18 ENCOUNTER — Ambulatory Visit (HOSPITAL_COMMUNITY)
Admission: RE | Admit: 2021-08-18 | Discharge: 2021-08-18 | Disposition: A | Discharge status: Home / Self Care | Payer: BC Managed Care – PPO | Source: Ambulatory Visit | Attending: Physician Assistant | Admitting: Physician Assistant

## 2021-08-18 ENCOUNTER — Encounter (HOSPITAL_COMMUNITY): Payer: Self-pay

## 2021-08-18 ENCOUNTER — Other Ambulatory Visit: Payer: Self-pay

## 2021-08-18 VITALS — BP 144/92 | HR 73 | Temp 98.3°F | Resp 18

## 2021-08-18 DIAGNOSIS — J029 Acute pharyngitis, unspecified: Secondary | ICD-10-CM | POA: Diagnosis not present

## 2021-08-18 DIAGNOSIS — J329 Chronic sinusitis, unspecified: Secondary | ICD-10-CM

## 2021-08-18 DIAGNOSIS — J40 Bronchitis, not specified as acute or chronic: Secondary | ICD-10-CM | POA: Insufficient documentation

## 2021-08-18 DIAGNOSIS — Z20822 Contact with and (suspected) exposure to covid-19: Secondary | ICD-10-CM | POA: Diagnosis not present

## 2021-08-18 DIAGNOSIS — R0981 Nasal congestion: Secondary | ICD-10-CM | POA: Diagnosis not present

## 2021-08-18 DIAGNOSIS — R051 Acute cough: Secondary | ICD-10-CM | POA: Diagnosis not present

## 2021-08-18 LAB — POC INFLUENZA A AND B ANTIGEN (URGENT CARE ONLY)
INFLUENZA A ANTIGEN, POC: NEGATIVE
INFLUENZA B ANTIGEN, POC: NEGATIVE

## 2021-08-18 MED ORDER — CEFDINIR 300 MG PO CAPS: 300.0000 mg | ORAL_CAPSULE | Freq: Two times a day (BID) | ORAL | 0 refills | Status: AC

## 2021-08-18 NOTE — ED Provider Notes (Signed)
?[facility] ? ? ? ?CSN: [id number] ?Arrival date & time: 08/18/21  1726 ? ? ?  ? ?History   ?Chief Complaint ?Chief Complaint  ?Patient presents with  ? Nasal Congestion  ? Sore Throat  ? Cough  ? ? ?HPI ?Troy Gentry is a 51 y.o. male.  ? ?Patient presents today with a weeklong history of URI symptoms.  Reports that he traveled internationally with others who have also had similar illnesses but does not know what they were ultimately diagnosed with.  He reports cough, sore throat, nasal congestion.  Denies any fever, chest pain, shortness of breath, nausea, vomiting.  He has been taking TheraFlu without improvement of symptoms.  He has had COVID-19 vaccines including boosters as well as a flu shot.  He denies any recent antibiotic use.  He denies history of allergies, asthma, COPD, smoking.  He reports that symptoms have persisted and slightly worsened prompting evaluation today. ? ? ?Past Medical History:  ?Diagnosis Date  ? Avulsion of hamstring muscle 2012  ? ? ?Patient Active Problem List  ? Diagnosis Date Noted  ? Routine health maintenance 02/20/2012  ? ? ?Past Surgical History:  ?Procedure Laterality Date  ? COLONOSCOPY    ? EYE SURGERY    ? Lasik  ? KNEE ARTHROSCOPY Left   ? x 2  ? KNEE ARTHROSCOPY W/ ACL RECONSTRUCTION    ? '95, '06 left knee  ? SHOULDER ARTHROSCOPY Right 06/17/2015  ? Procedure: DIAGNOSTIC OPERATIVE RIGHT SHOULDER ARTHROSCOPY, BICEPS RELEASE WITH BICEPS TENODESIS, UPPER SUBSCAPULARIS REPAIR;  Surgeon: Meredith Pel, MD;  Location: [facility];  Service: Orthopedics;  Laterality: Right;  ? ? ? ? ? ?Home Medications   ? ?Prior to Admission medications   ?Medication Sig Start Date End Date Taking? Authorizing Provider  ?cefdinir (OMNICEF) 300 MG capsule Take 1 capsule (300 mg total) by mouth 2 (two) times daily. 08/18/21  Yes Itati Brocksmith, Derry Skill, PA-C  ?CIALIS 20 MG tablet TAKE 1/2-1 TABLET BY MOUTH AS NEEDED FOR INTERCOURSE 09/25/15   [provider]  ?colchicine 0.6 MG tablet  1 po bid x 3 days then q d prn for persistent symptoms 08/26/18   Meredith Pel, MD  ?gabapentin (NEURONTIN) 100 MG capsule Take 1 capsule (100 mg total) by mouth 2 (two) times daily. 06/19/21   Meredith Pel, MD  ?minoxidil (LONITEN) 2.5 MG tablet Take 2.5 mg by mouth daily.    [provider]  ?predniSONE (DELTASONE) 50 MG tablet Take 1 tablet daily with food for 5 days until finished 07/21/21   Magnus Sinning, MD  ? ? ?Family History ?Family History  ?Problem Relation Age of Onset  ? Arthritis Mother   ? Heart disease Father   ? Hypertension Father   ? Hyperlipidemia Father   ? Sleep apnea Father   ? Colitis Sister   ? Diabetes Sister   ? Sleep apnea Sister   ? Cancer Paternal Uncle   ?     colon  ? Heart disease Maternal Grandfather   ?     CAD  ? Cancer Paternal Grandmother   ?     colon  ? Diabetes Paternal Grandmother   ? Cancer Paternal Grandfather   ?     colon  ? Colon cancer Paternal Grandfather   ? Colon polyps Neg Hx   ? Esophageal cancer Neg Hx   ? Stomach cancer Neg Hx   ? Rectal cancer Neg Hx   ? ? ?Social History ?  Social History  ? ?Tobacco Use  ? Smoking status: Never  ? Smokeless tobacco: Never  ?Vaping Use  ? Vaping Use: Never used  ?Substance Use Topics  ? Alcohol use: Not Currently  ?  Alcohol/week: 3.0 standard drinks  ?  Types: 3 Standard drinks or equivalent per week  ? Drug use: No  ? ? ? ?Allergies   ?Patient has no known allergies. ? ? ?Review of Systems ?Review of Systems  ?Constitutional:  Positive for activity change. Negative for appetite change, fatigue and fever.  ?HENT:  Positive for congestion, postnasal drip and sore throat. Negative for sinus pressure and sneezing.   ?Respiratory:  Positive for cough. Negative for shortness of breath.   ?Cardiovascular:  Negative for chest pain.  ?Gastrointestinal:  Negative for abdominal pain, diarrhea, nausea and vomiting.  ?Neurological:  Negative for dizziness, light-headedness and headaches.  ? ? ?Physical Exam ?Triage  Vital Signs ?ED Triage Vitals  ?Enc Vitals Group  ?   BP 08/18/21 1742 (!) 144/92  ?   Pulse Rate 08/18/21 1742 73  ?   Resp 08/18/21 1742 18  ?   Temp 08/18/21 1742 98.3 ?F (36.8 ?C)  ?   Temp src --   ?   SpO2 08/18/21 1742 100 %  ?   Weight --   ?   Height --   ?   Head Circumference --   ?   Peak Flow --   ?   Pain Score 08/18/21 1741 0  ?   Pain Loc --   ?   Pain Edu? --   ?   Excl. in [place]? --   ? ?No data found. ? ?Updated Vital Signs ?BP (!) 144/92   Pulse 73   Temp 98.3 ?F (36.8 ?C)   Resp 18   SpO2 100%  ? ?Visual Acuity ?Right Eye Distance:   ?Left Eye Distance:   ?Bilateral Distance:   ? ?Right Eye Near:   ?Left Eye Near:    ?Bilateral Near:    ? ?Physical Exam ?Vitals reviewed.  ?Constitutional:   ?   General: He is awake.  ?   Appearance: Normal appearance. He is well-developed. He is not ill-appearing.  ?   Comments: Very pleasant male appears stated age in no acute distress sitting comfortably in exam room  ?HENT:  ?   Head: Normocephalic and atraumatic.  ?   Right Ear: Tympanic membrane, ear canal and external ear normal. Tympanic membrane is not erythematous or bulging.  ?   Left Ear: Tympanic membrane, ear canal and external ear normal. Tympanic membrane is not erythematous or bulging.  ?   Nose:  ?   Right Sinus: Maxillary sinus tenderness present. No frontal sinus tenderness.  ?   Left Sinus: Maxillary sinus tenderness present. No frontal sinus tenderness.  ?   Mouth/Throat:  ?   Pharynx: Uvula midline. Posterior oropharyngeal erythema present. No oropharyngeal exudate or uvula swelling.  ?Cardiovascular:  ?   Rate and Rhythm: Normal rate and regular rhythm.  ?   Heart sounds: Normal heart sounds, S1 normal and S2 normal. No murmur heard. ?Pulmonary:  ?   Effort: Pulmonary effort is normal. No accessory muscle usage or respiratory distress.  ?   Breath sounds: Normal breath sounds. No stridor. No wheezing, rhonchi or rales.  ?   Comments: Clear to auscultation bilaterally ?Abdominal:  ?    General: Bowel sounds are normal.  ?   Palpations: Abdomen is soft.  ?  Tenderness: There is no abdominal tenderness.  ?Neurological:  ?   Mental Status: He is alert.  ?Psychiatric:     ?   Behavior: Behavior is cooperative.  ? ? ? ?UC Treatments / Results  ?Labs ?(all labs ordered are listed, but only abnormal results are displayed) ?Labs Reviewed  ?SARS CORONAVIRUS 2 (TAT 6-24 HRS)  ?POC INFLUENZA A AND B ANTIGEN (URGENT CARE ONLY)  ? ? ?EKG ? ? ?Radiology ?No results found. ? ?Procedures ?Procedures (including critical care time) ? ?Medications Ordered in UC ?Medications - No data to display ? ?Initial Impression / Assessment and Plan / UC Course  ?I have reviewed the triage vital signs and the nursing notes. ? ?Pertinent labs & imaging results that were available during my care of the patient were reviewed by me and considered in my medical decision making (see chart for details). ? ?  ? ?Discussed that given patient has been symptomatic for more than 5 days viral testing would not change management but he was interested in having flu and COVID testing done so these were obtained today.  COVID and flu test are pending and we will contact patient through MyChart with these results if positive.  Discussed that we do not typically start antibiotics until days 10-14 but given prolonged symptoms he was prescribed Omnicef to instructed to start this medication if symptoms do not improve within a few days.  He is to use over-the-counter medications including Mucinex, Tylenol, Flonase.  He is to rest and drink plenty fluid.  Discussed that if he has any worsening symptoms including high fever, chest pain, shortness of breath, nausea/vomiting interfering with oral intake, worsening cough he needs to be seen immediately.  Strict return precautions given to which he expressed understanding. ? ?Final Clinical Impressions(s) / UC Diagnoses  ? ?Final diagnoses:  ?Sinobronchitis  ?Acute cough  ? ? ? ?Discharge Instructions    ? ?  ?Monitor your MyChart for flu and COVID-19 testing results.  If your symptoms not improving within a few days you can start the antibiotic as we discussed.  Use Mucinex, Flonase, Tylenol for symptom relie

## 2021-08-18 NOTE — Discharge Instructions (Signed)
Monitor your MyChart for flu and COVID-19 testing results.  If your symptoms not improving within a few days you can start the antibiotic as we discussed.  Use Mucinex, Flonase, Tylenol for symptom relief.  Make sure you are resting and drinking plenty of fluid.  If you have any worsening symptoms including high fever, chest pain, shortness of breath, nausea/vomiting interfering with oral intake, worsening cough you need to be seen immediately. ?

## 2021-08-18 NOTE — ED Triage Notes (Signed)
Pt reports for past 5-6 days he has had cough ,sore throat,congestion with green mucous. ?

## 2021-08-18 NOTE — ED Notes (Signed)
This Probation officer reported to Provider Pt does not want to wait for results of flu test. Pt is ready to leave. ?

## 2021-08-19 LAB — SARS CORONAVIRUS 2 (TAT 6-24 HRS): SARS Coronavirus 2: NEGATIVE

## 2021-08-21 DIAGNOSIS — M5451 Vertebrogenic low back pain: Secondary | ICD-10-CM | POA: Diagnosis not present

## 2021-09-02 DIAGNOSIS — M5451 Vertebrogenic low back pain: Secondary | ICD-10-CM | POA: Diagnosis not present

## 2021-10-21 DIAGNOSIS — Z0000 Encounter for general adult medical examination without abnormal findings: Secondary | ICD-10-CM | POA: Diagnosis not present

## 2021-10-21 DIAGNOSIS — M109 Gout, unspecified: Secondary | ICD-10-CM | POA: Diagnosis not present

## 2021-10-21 DIAGNOSIS — E785 Hyperlipidemia, unspecified: Secondary | ICD-10-CM | POA: Diagnosis not present

## 2021-10-21 DIAGNOSIS — Z125 Encounter for screening for malignant neoplasm of prostate: Secondary | ICD-10-CM | POA: Diagnosis not present

## 2021-10-28 DIAGNOSIS — Z1339 Encounter for screening examination for other mental health and behavioral disorders: Secondary | ICD-10-CM | POA: Diagnosis not present

## 2021-10-28 DIAGNOSIS — Z1331 Encounter for screening for depression: Secondary | ICD-10-CM | POA: Diagnosis not present

## 2021-10-28 DIAGNOSIS — Z0000 Encounter for general adult medical examination without abnormal findings: Secondary | ICD-10-CM | POA: Diagnosis not present

## 2021-10-28 DIAGNOSIS — Z23 Encounter for immunization: Secondary | ICD-10-CM | POA: Diagnosis not present

## 2021-10-28 DIAGNOSIS — E785 Hyperlipidemia, unspecified: Secondary | ICD-10-CM | POA: Diagnosis not present

## 2022-05-11 DIAGNOSIS — R221 Localized swelling, mass and lump, neck: Secondary | ICD-10-CM | POA: Diagnosis not present

## 2022-05-20 DIAGNOSIS — J387 Other diseases of larynx: Secondary | ICD-10-CM | POA: Diagnosis not present

## 2022-05-20 DIAGNOSIS — R221 Localized swelling, mass and lump, neck: Secondary | ICD-10-CM | POA: Diagnosis not present

## 2022-06-10 DIAGNOSIS — J383 Other diseases of vocal cords: Secondary | ICD-10-CM | POA: Diagnosis not present

## 2022-11-09 ENCOUNTER — Telehealth: Payer: Self-pay | Admitting: Physical Medicine and Rehabilitation

## 2022-11-09 NOTE — Telephone Encounter (Signed)
Patient called. He would like an appointment with Dr. Newton. 

## 2022-11-13 NOTE — Telephone Encounter (Signed)
Spoke with patient and he is requesting an injection with the same type of pain. Last injection 07/21/21 lasted 9 months and he had more than 75% relief. No new falls, accidents or injuries. Please advise

## 2022-11-15 ENCOUNTER — Other Ambulatory Visit: Payer: Self-pay | Admitting: Physical Medicine and Rehabilitation

## 2022-11-15 DIAGNOSIS — M5416 Radiculopathy, lumbar region: Secondary | ICD-10-CM

## 2022-11-15 DIAGNOSIS — M5116 Intervertebral disc disorders with radiculopathy, lumbar region: Secondary | ICD-10-CM

## 2022-12-08 ENCOUNTER — Other Ambulatory Visit: Payer: Self-pay

## 2022-12-08 ENCOUNTER — Ambulatory Visit: Payer: No Typology Code available for payment source | Admitting: Physical Medicine and Rehabilitation

## 2022-12-08 VITALS — BP 127/77 | HR 65

## 2022-12-08 DIAGNOSIS — M5116 Intervertebral disc disorders with radiculopathy, lumbar region: Secondary | ICD-10-CM | POA: Diagnosis not present

## 2022-12-08 DIAGNOSIS — D171 Benign lipomatous neoplasm of skin and subcutaneous tissue of trunk: Secondary | ICD-10-CM | POA: Diagnosis not present

## 2022-12-08 DIAGNOSIS — S39012A Strain of muscle, fascia and tendon of lower back, initial encounter: Secondary | ICD-10-CM

## 2022-12-08 DIAGNOSIS — M5416 Radiculopathy, lumbar region: Secondary | ICD-10-CM

## 2022-12-08 MED ORDER — METHYLPREDNISOLONE ACETATE 80 MG/ML IJ SUSP
80.0000 mg | Freq: Once | INTRAMUSCULAR | Status: AC
  Administered 2022-12-08: 80 mg

## 2022-12-08 NOTE — Progress Notes (Signed)
Troy Gentry - 52 y.o. male MRN [id number]  Date of birth: 02-Sep-1970  Office Visit Note: Visit Date: 12/08/2022 PCP: Creola Corn, MD Referred by: Creola Corn, MD  Subjective: Chief Complaint  Patient presents with   Lower Back - Pain   HPI:  Troy Gentry is a 52 y.o. male who comes in today for evaluation and management at the request of Dr. Burnard Bunting for chronic and worsening and severe at times low back pain with some referral into the right hip and leg.  Last saw the patient was in January of this year and he was having more left-sided complaints with no new injury.  Prior to that the injections that it helped and the most were on the right side.  MRI from 2021 shows disc extrusion at L4-5 with lateral recess narrowing and some early arthritic change.  No high-grade nerve compression.  He reports onset of right-sided symptoms for a few months now with worsening with prolonged sitting and activity and twisting.  He has not had any focal weakness.  He has had no bowel or bladder complaints.  No red flag symptoms.  He has been continue with home exercise program from prior physical therapy.  We discussed at length today exercises for core strengthening and long-term outlook of disc herniations and how they can resorb over time.  We did have a long discussion today with him about exercises and activity modification. I spent more than 25 minutes speaking face-to-face with the patient with 50% of the time in counseling and discussing coordination of care.     Review of Systems  Musculoskeletal:  Positive for back pain and joint pain.  Neurological:  Positive for tingling.  All other systems reviewed and are negative.  Otherwise per HPI.  Assessment & Plan: Visit Diagnoses:    ICD-10-CM   1. Lumbar radiculopathy  M54.16 XR C-ARM NO REPORT    Epidural Steroid injection    methylPREDNISolone acetate (DEPO-MEDROL) injection 80 mg    2. Strain of lumbar region, initial encounter   S39.012A     3. Radiculopathy due to lumbar intervertebral disc disorder  M51.16     4. 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  Exacerbation of chronic and worsening and severe low back and at times radicular pain.  As switched at times from left to right.  Known disc herniation at L4-5 with bilateral lateral recess narrowing.  No high-grade nerve compression at the time of the MRI.  This seems part and parcel to the same diagnosis.  The epidural did help him more than anything in the past and so we did repeat this today.  Consider new imaging.  2.  Lumbar sprain strain given that the had some acute findings after movement and twisting.  Has findings of a lipoma as well.  These conditions went over with him today.    Meds & Orders:  Meds ordered this encounter  Medications   methylPREDNISolone acetate (DEPO-MEDROL) injection 80 mg    Orders Placed This Encounter  Procedures   XR C-ARM NO REPORT   Epidural Steroid injection    Follow-up: Return if symptoms worsen or fail to improve.   Procedures: No procedures performed  Lumbar Epidural Steroid Injection - Interlaminar Approach with Fluoroscopic Guidance  Patient: Troy Gentry      Date of Birth: 03-Jun-1970 MRN: [id number] PCP: Creola Corn, MD      Visit Date: 12/08/2022  Universal Protocol:     Consent Given By: the patient  Position: PRONE  Additional Comments: Vital signs were monitored before and after the procedure. Patient was prepped and draped in the usual sterile fashion. The correct patient, procedure, and site was verified.   Injection Procedure Details:   Procedure diagnoses: Lumbar radiculopathy [M54.16]   Meds Administered:  Meds ordered this encounter  Medications   methylPREDNISolone acetate (DEPO-MEDROL) injection 80 mg     Laterality: Right  Location/Site:  L5-S1  Needle: 3.5 in., 20 ga. Tuohy  Needle Placement: Paramedian epidural  Findings:   -Comments: Excellent flow of  contrast into the epidural space.  Procedure Details: Using a paramedian approach from the side mentioned above, the region overlying the inferior lamina was localized under fluoroscopic visualization and the soft tissues overlying this structure were infiltrated with 4 ml. of 1% Lidocaine without Epinephrine. The Tuohy needle was inserted into the epidural space using a paramedian approach.   The epidural space was localized using loss of resistance along with counter oblique bi-planar fluoroscopic views.  After negative aspirate for air, blood, and CSF, a 2 ml. volume of Isovue-250 was injected into the epidural space and the flow of contrast was observed. Radiographs were obtained for documentation purposes.    The injectate was administered into the level noted above.   Additional Comments:  The patient tolerated the procedure well Dressing: 2 x 2 sterile gauze and Band-Aid    Post-procedure details: Patient was observed during the procedure. Post-procedure instructions were reviewed.  Patient left the clinic in stable condition.   Clinical History: MRI LUMBAR SPINE WITHOUT CONTRAST    TECHNIQUE:  Multiplanar, multisequence MR imaging of the lumbar spine was  performed. No intravenous contrast was administered.    COMPARISON:  No prior MRI    FINDINGS:  Segmentation: Standard.    Alignment:  Trace retrolisthesis L5 on S1.  Otherwise normal.    Vertebrae:  No fracture, evidence of discitis, or bone lesion.    Conus medullaris and cauda equina: Conus extends to the L2 level.  Conus and cauda equina appear normal.    Paraspinal and other soft tissues: Choose 1    Disc levels:    T12-L1: No significant disc bulge. No spinal canal stenosis or  neural foraminal narrowing.    L1-L2: No significant disc bulge. No spinal canal stenosis or neural  foraminal narrowing.    L2-L3: No significant disc bulge. No spinal canal stenosis or neural  foraminal narrowing.    L3-L4:  Mild disc desiccation and mild disc bulge. Mild facet  arthropathy. Mild narrowing of the lateral recesses. No spinal canal  stenosis. Mild bilateral neural foraminal narrowing.    L4-L5: Broad-based disc bulge with superimposed central disc  extrusion, with 4 mm of caudal migration. Mild facet arthropathy. No  spinal canal stenosis. Narrowing of the left greater than right  lateral recess. Mild left and moderate right neural foraminal  narrowing.    L5-S1: Trace retrolisthesis and broad-based disc bulge. Moderate  facet arthropathy. No spinal canal stenosis. Moderate bilateral  neural foraminal narrowing.    IMPRESSION:  1. L5-S1 moderate bilateral neural foraminal narrowing.  2. L4-L5 moderate right and mild left neural foraminal narrowing.  Narrowing of the left-greater-than-right lateral recess at this  level could affect the descending L5 nerves.  3. L3-L4 mild bilateral neural foraminal narrowing. Mild narrowing  of the lateral recesses at this level could affect the descending L4  nerves.  4. No spinal canal  stenosis.      Electronically Signed    By: Wiliam Ke M.D.    On: 03/21/2021 01:56     Objective:  VS:  HT:    WT:   BMI:     BP:127/77  HR:65bpm  TEMP: ( )  RESP:  Physical Exam Vitals and nursing note reviewed.  Constitutional:      General: He is not in acute distress.    Appearance: Normal appearance. He is well-developed. He is not ill-appearing.  HENT:     Head: Normocephalic and atraumatic.     Right Ear: External ear normal.     Left Ear: External ear normal.     Nose: Nose normal. No congestion.     Mouth/Throat:     Mouth: Mucous membranes are moist.     Pharynx: Oropharynx is clear.  Eyes:     Extraocular Movements: Extraocular movements intact.     Conjunctiva/sclera: Conjunctivae normal.     Pupils: Pupils are equal, round, and reactive to light.  Neck:     Trachea: No tracheal deviation.  Cardiovascular:     Rate and Rhythm:  Normal rate and regular rhythm.     Pulses: Normal pulses.  Pulmonary:     Effort: Pulmonary effort is normal. No respiratory distress.     Breath sounds: Normal breath sounds.  Abdominal:     General: There is no distension.     Palpations: Abdomen is soft.     Tenderness: There is no guarding or rebound.  Musculoskeletal:        General: No tenderness, deformity or signs of injury.     Cervical back: Normal range of motion and neck supple.     Right lower leg: No edema.     Left lower leg: No edema.     Comments: Patient has good distal strength without clonus.  Skin:    General: Skin is warm and dry.     Findings: No erythema or rash.  Neurological:     General: No focal deficit present.     Mental Status: He is alert and oriented to person, place, and time.     Sensory: No sensory deficit.     Motor: No weakness or abnormal muscle tone.     Coordination: Coordination normal.     Gait: Gait normal.  Psychiatric:        Mood and Affect: Mood normal.        Behavior: Behavior normal.        Thought Content: Thought content normal.      Imaging: No results found.

## 2022-12-08 NOTE — Progress Notes (Signed)
Functional Pain Scale - descriptive words and definitions  Moderate (4)   Constantly aware of pain, can complete ADLs with modification/sleep marginally affected at times/passive distraction is of no use, but active distraction gives some relief. Moderate range order  Average Pain 5   +Driver, -BT, -Dye Allergies.  Lower back pain in the middle that goes to the right side

## 2022-12-08 NOTE — Patient Instructions (Signed)

## 2022-12-20 ENCOUNTER — Encounter: Payer: Self-pay | Admitting: Physical Medicine and Rehabilitation

## 2022-12-20 NOTE — Procedures (Signed)
Lumbar Epidural Steroid Injection - Interlaminar Approach with Fluoroscopic Guidance  Patient: Troy Gentry      Date of Birth: 1970-06-19 MRN: [id number] PCP: Creola Corn, MD      Visit Date: 12/08/2022   Universal Protocol:     Consent Given By: the patient  Position: PRONE  Additional Comments: Vital signs were monitored before and after the procedure. Patient was prepped and draped in the usual sterile fashion. The correct patient, procedure, and site was verified.   Injection Procedure Details:   Procedure diagnoses: Lumbar radiculopathy [M54.16]   Meds Administered:  Meds ordered this encounter  Medications   methylPREDNISolone acetate (DEPO-MEDROL) injection 80 mg     Laterality: Right  Location/Site:  L5-S1  Needle: 3.5 in., 20 ga. Tuohy  Needle Placement: Paramedian epidural  Findings:   -Comments: Excellent flow of contrast into the epidural space.  Procedure Details: Using a paramedian approach from the side mentioned above, the region overlying the inferior lamina was localized under fluoroscopic visualization and the soft tissues overlying this structure were infiltrated with 4 ml. of 1% Lidocaine without Epinephrine. The Tuohy needle was inserted into the epidural space using a paramedian approach.   The epidural space was localized using loss of resistance along with counter oblique bi-planar fluoroscopic views.  After negative aspirate for air, blood, and CSF, a 2 ml. volume of Isovue-250 was injected into the epidural space and the flow of contrast was observed. Radiographs were obtained for documentation purposes.    The injectate was administered into the level noted above.   Additional Comments:  The patient tolerated the procedure well Dressing: 2 x 2 sterile gauze and Band-Aid    Post-procedure details: Patient was observed during the procedure. Post-procedure instructions were reviewed.  Patient left the clinic in stable condition.

## 2023-06-23 ENCOUNTER — Other Ambulatory Visit (INDEPENDENT_AMBULATORY_CARE_PROVIDER_SITE_OTHER): Payer: No Typology Code available for payment source

## 2023-06-23 ENCOUNTER — Ambulatory Visit: Payer: No Typology Code available for payment source | Admitting: Orthopedic Surgery

## 2023-06-23 ENCOUNTER — Encounter: Payer: Self-pay | Admitting: Orthopedic Surgery

## 2023-06-23 DIAGNOSIS — M5416 Radiculopathy, lumbar region: Secondary | ICD-10-CM | POA: Diagnosis not present

## 2023-06-23 NOTE — Progress Notes (Signed)
Office Visit Note   Patient: Troy Gentry           Date of Birth: [birth year]           MRN: [id number] Visit Date: 06/23/2023 Requested by: Creola Corn, MD [address] [place],  [place] [address] PCP: Creola Corn, MD  Subjective: Chief Complaint  Patient presents with   Lower Back - Pain    HPI: Troy Gentry is a 53 y.o. male who presents to the office reporting continued low back pain.  States that overall his back is not appreciably better than it was 6 months ago.  He did have an epidural steroid injection 12/08/2022 which helped his left leg pain significantly.  He does do stretches every morning as well as Pilates.  Has a history of going to physical therapy at [facility] physical therapy and incorporates that stretching routine and his routine as well.  He stands at his desk at work because it does hurt him to do prolonged sitting.  He is able to do the elliptical and the bike for exercise.  Does create pain for him to golf more than 2 or 3 holes.  This really only affects his back and he does not have any recurrent leg pain with this.  Hurts for him to watch a movie.  Denies any weakness in his legs.  He actually is okay for waterskiing but any type of twisting motion such as with golf is painful.  Does have pain on a daily basis.  Takes occasional anti-inflammatories which do not help much.  States that walking is easier and less painful for him than sitting.  MRI scan from 2022 is reviewed and he did have an L4-5 central disc at that time along with disc desiccation at L3-4 but more so at L4-5 and L5-S1.  Some foraminal stenosis was also present on both sides at L5-S1..                ROS: All systems reviewed are negative as they relate to the chief complaint within the history of present illness.  Patient denies fevers or chills.  Assessment & Plan: Visit Diagnoses:  1. Lumbar radiculopathy     Plan: Impression is low back pain which could be central disc mediated from  L4-5 or facet arthritis and foraminal stenosis mediated at L5-S1.  Alternatively this could also represent a new problem at a different level.  He is considering surgical intervention.  MRI scan lumbar spine indicated to evaluate L4-5 and L5-S1 for progressive changes.  I would also like to refer him to Dr. Alvester Morin to see if he can do some diagnostic and therapeutic injections to try to sort out whether or not his predominant back pain is more facet mediated pain or if it is disc mediated pain at a different level.  Would also like to refer him to Dr. Wynetta Emery to undergo Surgical evaluation per patient request.  In general I talked with Raimon about the risk and benefits as I understand them of spinal fusion surgery.  In general for [place] I think it would be important to figure out what the pain generators are in his back whether it is disc or facet joint arthritis mediated.  If it is more facet joint mediated I think it would be worth considering facet joint injections and possible ablation prior to undergoing surgery.  We will see what Dr. Weston Blas opinion is on that.  Follow-up with me as needed.  Follow-Up Instructions:  No follow-ups on file.   Orders:  Orders Placed This Encounter  Procedures   XR Lumbar Spine 2-3 Views   MR Lumbar Spine w/o contrast   Ambulatory referral to Physical Medicine Rehab   Ambulatory referral to Neurosurgery   No orders of the defined types were placed in this encounter.     Procedures: No procedures performed   Clinical Data: No additional findings.  Objective: Vital Signs: There were no vitals taken for this visit.  Physical Exam:  Constitutional: Patient appears well-developed HEENT:  Head: Normocephalic Eyes:EOM are normal Neck: Normal range of motion Cardiovascular: Normal rate Pulmonary/chest: Effort normal Neurologic: Patient is alert Skin: Skin is warm Psychiatric: Patient has normal mood and affect  Ortho Exam: Ortho exam demonstrates normal  gait alignment.  No nerve root tension signs.  5 out of 5 ankle dorsiflexion plantarflexion quad hamstring and hip flexion abduction and adduction strength.  No muscle atrophy.  He has excellent upper leg muscle tone bilaterally.  No paresthesias L1-S1 bilaterally.  Reflexes symmetric 0 to 1+ out of 4 bilateral patella and Achilles. Specialty Comments:  MRI LUMBAR SPINE WITHOUT CONTRAST    TECHNIQUE:  Multiplanar, multisequence MR imaging of the lumbar spine was  performed. No intravenous contrast was administered.    COMPARISON:  No prior MRI    FINDINGS:  Segmentation: Standard.    Alignment:  Trace retrolisthesis L5 on S1.  Otherwise normal.    Vertebrae:  No fracture, evidence of discitis, or bone lesion.    Conus medullaris and cauda equina: Conus extends to the L2 level.  Conus and cauda equina appear normal.    Paraspinal and other soft tissues: Choose 1    Disc levels:    T12-L1: No significant disc bulge. No spinal canal stenosis or  neural foraminal narrowing.    L1-L2: No significant disc bulge. No spinal canal stenosis or neural  foraminal narrowing.    L2-L3: No significant disc bulge. No spinal canal stenosis or neural  foraminal narrowing.    L3-L4: Mild disc desiccation and mild disc bulge. Mild facet  arthropathy. Mild narrowing of the lateral recesses. No spinal canal  stenosis. Mild bilateral neural foraminal narrowing.    L4-L5: Broad-based disc bulge with superimposed central disc  extrusion, with 4 mm of caudal migration. Mild facet arthropathy. No  spinal canal stenosis. Narrowing of the left greater than right  lateral recess. Mild left and moderate right neural foraminal  narrowing.    L5-S1: Trace retrolisthesis and broad-based disc bulge. Moderate  facet arthropathy. No spinal canal stenosis. Moderate bilateral  neural foraminal narrowing.    IMPRESSION:  1. L5-S1 moderate bilateral neural foraminal narrowing.  2. L4-L5 moderate right and  mild left neural foraminal narrowing.  Narrowing of the left-greater-than-right lateral recess at this  level could affect the descending L5 nerves.  3. L3-L4 mild bilateral neural foraminal narrowing. Mild narrowing  of the lateral recesses at this level could affect the descending L4  nerves.  4. No spinal canal stenosis.      Electronically Signed    By: Wiliam Ke M.D.    On: 03/21/2021 01:56  Imaging: No results found.   PMFS History: Patient Active Problem List   Diagnosis Date Noted   Routine health maintenance 02/20/2012   Past Medical History:  Diagnosis Date   Avulsion of hamstring muscle 2012    Family History  Problem Relation Age of Onset   Arthritis Mother    Heart disease Father  Hypertension Father    Hyperlipidemia Father    Sleep apnea Father    Colitis Sister    Diabetes Sister    Sleep apnea Sister    Cancer Paternal Uncle        colon   Heart disease Maternal Grandfather        CAD   Cancer Paternal Grandmother        colon   Diabetes Paternal Grandmother    Cancer Paternal Grandfather        colon   Colon cancer Paternal Grandfather    Colon polyps Neg Hx    Esophageal cancer Neg Hx    Stomach cancer Neg Hx    Rectal cancer Neg Hx     Past Surgical History:  Procedure Laterality Date   COLONOSCOPY     EYE SURGERY     Lasik   KNEE ARTHROSCOPY Left    x 2   KNEE ARTHROSCOPY W/ ACL RECONSTRUCTION     '95, '06 left knee   SHOULDER ARTHROSCOPY Right 06/17/2015   Procedure: DIAGNOSTIC OPERATIVE RIGHT SHOULDER ARTHROSCOPY, BICEPS RELEASE WITH BICEPS TENODESIS, UPPER SUBSCAPULARIS REPAIR;  Surgeon: Cammy Copa, MD;  Location: [facility] OR;  Service: Orthopedics;  Laterality: Right;   Social History   Occupational History   Occupation: Loss adjuster, chartered  Tobacco Use   Smoking status: Never   Smokeless tobacco: Never  Vaping Use   Vaping status: Never Used  Substance and Sexual Activity   Alcohol use: Not Currently     Alcohol/week: 3.0 standard drinks of alcohol    Types: 3 Standard drinks or equivalent per week   Drug use: No   Sexual activity: Yes    Partners: Female

## 2023-06-25 ENCOUNTER — Other Ambulatory Visit: Payer: Self-pay | Admitting: Orthopedic Surgery

## 2023-06-25 DIAGNOSIS — M5416 Radiculopathy, lumbar region: Secondary | ICD-10-CM

## 2023-06-28 ENCOUNTER — Encounter: Payer: Self-pay | Admitting: Orthopedic Surgery

## 2023-07-02 ENCOUNTER — Ambulatory Visit
Admission: RE | Admit: 2023-07-02 | Discharge: 2023-07-02 | Disposition: A | Discharge status: Home / Self Care | Payer: No Typology Code available for payment source | Source: Ambulatory Visit | Attending: Orthopedic Surgery | Admitting: Orthopedic Surgery

## 2023-07-02 DIAGNOSIS — M5416 Radiculopathy, lumbar region: Secondary | ICD-10-CM

## 2023-07-06 ENCOUNTER — Encounter: Payer: Self-pay | Admitting: Orthopedic Surgery

## 2023-07-06 NOTE — Telephone Encounter (Signed)
I texted him.  

## 2024-01-26 ENCOUNTER — Ambulatory Visit: Admitting: Orthopedic Surgery

## 2024-01-28 ENCOUNTER — Ambulatory Visit (INDEPENDENT_AMBULATORY_CARE_PROVIDER_SITE_OTHER): Admitting: Orthopedic Surgery

## 2024-01-28 DIAGNOSIS — M79672 Pain in left foot: Secondary | ICD-10-CM

## 2024-01-29 ENCOUNTER — Encounter: Payer: Self-pay | Admitting: Orthopedic Surgery

## 2024-01-29 NOTE — Progress Notes (Signed)
 I saw Troy Gentry on the day of his prior clinic appointment.  I was running a little bit late and he had some meetings to go to.  I saw him at his house as he does live near me.  Patient describes about 1 to 2 weeks of left heel pain which is very focal at the Achilles tendon insertion.  With hyper dorsiflexion of the foot he has some electric sensation which stays focal to the heel and does not radiate to the lateral aspect of the foot.  No history of injury.  He has been able to play golf after facet block injections.  On exam he has normal gait alignment.  No tenderness or swelling in the watershed area of each tendon.  Does have a little focal tenderness around the heel with equivocal Tinel's on the left and no Tinel's on the right.  No paresthesias in the sural nerve distribution on that left-hand side.  Heel cords are minimally tight bilaterally.  Has symmetric tibiotalar subtalar transverse tarsal range of motion.  No real tenderness to palpation or palpable spur around the Achilles insertion on either heel.  Impression is likely nerve branch irritation of the sural nerve going to the heel.  Does not look like there is any type of impending Achilles tendon problem.  Does not feel like on palpation that he has a significant spur at the attachment of the Achilles tendon on the left or right-hand side.  I think a topical anti-inflammatory could be beneficial in this circumstance along with some heel cord stretching once his symptoms improved.SABRA  He will follow-up with us  as needed.

## 2024-04-03 ENCOUNTER — Encounter: Payer: Self-pay | Admitting: Radiology
